# Patient Record
Sex: Male | Born: 1960 | Race: White | Hispanic: No | Marital: Married | Smoking: Never smoker
Health system: Southern US, Community
[De-identification: ages and names within clinical notes are randomized; demographics above are authoritative.]

## PROBLEM LIST (undated history)

## (undated) DIAGNOSIS — N200 Calculus of kidney: Secondary | ICD-10-CM

## (undated) DIAGNOSIS — Z973 Presence of spectacles and contact lenses: Secondary | ICD-10-CM

## (undated) DIAGNOSIS — I10 Essential (primary) hypertension: Secondary | ICD-10-CM

## (undated) DIAGNOSIS — Z972 Presence of dental prosthetic device (complete) (partial): Secondary | ICD-10-CM

## (undated) HISTORY — DX: Essential (primary) hypertension: I10

---

## 2005-12-27 HISTORY — PX: FOOT SURGERY: SHX648

## 2008-12-12 DIAGNOSIS — R7989 Other specified abnormal findings of blood chemistry: Secondary | ICD-10-CM | POA: Insufficient documentation

## 2014-05-30 ENCOUNTER — Ambulatory Visit: Payer: Self-pay | Admitting: Family Medicine

## 2014-12-04 ENCOUNTER — Emergency Department: Payer: Self-pay | Admitting: Emergency Medicine

## 2014-12-04 LAB — CBC
HCT: 43 % (ref 40.0–52.0)
HGB: 14.2 g/dL (ref 13.0–18.0)
MCH: 29.2 pg (ref 26.0–34.0)
MCHC: 32.9 g/dL (ref 32.0–36.0)
MCV: 89 fL (ref 80–100)
Platelet: 219 10*3/uL (ref 150–440)
RBC: 4.86 10*6/uL (ref 4.40–5.90)
RDW: 13.7 % (ref 11.5–14.5)
WBC: 10.4 10*3/uL (ref 3.8–10.6)

## 2014-12-04 LAB — COMPREHENSIVE METABOLIC PANEL
ALK PHOS: 94 U/L
ALT: 19 U/L
Albumin: 4.1 g/dL (ref 3.4–5.0)
Anion Gap: 5 — ABNORMAL LOW (ref 7–16)
BUN: 10 mg/dL (ref 7–18)
Bilirubin,Total: 0.6 mg/dL (ref 0.2–1.0)
Calcium, Total: 9.4 mg/dL (ref 8.5–10.1)
Chloride: 100 mmol/L (ref 98–107)
Co2: 30 mmol/L (ref 21–32)
Creatinine: 1.5 mg/dL — ABNORMAL HIGH (ref 0.60–1.30)
EGFR (African American): >60
GFR CALC NON AF AMER: 52 — AB
Glucose: 118 mg/dL — ABNORMAL HIGH (ref 65–99)
OSMOLALITY: 270 (ref 275–301)
POTASSIUM: 4.1 mmol/L (ref 3.5–5.1)
SGOT(AST): 11 U/L — ABNORMAL LOW (ref 15–37)
Sodium: 135 mmol/L — ABNORMAL LOW (ref 136–145)
TOTAL PROTEIN: 7.6 g/dL (ref 6.4–8.2)

## 2014-12-04 LAB — URINALYSIS, COMPLETE
Bilirubin,UR: NEGATIVE
Glucose,UR: NEGATIVE mg/dL (ref 0–75)
Leukocyte Esterase: NEGATIVE
Nitrite: NEGATIVE
PH: 7 (ref 4.5–8.0)
PROTEIN: NEGATIVE
SQUAMOUS EPITHELIAL: NONE SEEN
Specific Gravity: 1.012 (ref 1.003–1.030)
WBC UR: 2 /HPF (ref 0–5)

## 2015-01-02 ENCOUNTER — Ambulatory Visit: Payer: Self-pay | Admitting: Urology

## 2015-01-09 ENCOUNTER — Ambulatory Visit: Payer: Self-pay | Admitting: Urology

## 2015-01-09 LAB — TSH: TSH: 1.64 u[IU]/mL (ref <=5.90)

## 2015-01-09 LAB — LIPID PANEL
CHOLESTEROL: 218 mg/dL — AB (ref 0–200)
HDL: 46 mg/dL (ref 35–70)
LDL Cholesterol: 159 mg/dL
TRIGLYCERIDES: 63 mg/dL (ref 40–160)

## 2015-01-09 LAB — CBC AND DIFFERENTIAL
HEMATOCRIT: 42 % (ref 41–53)
Hemoglobin: 14.7 g/dL (ref 13.5–17.5)
Platelets: 230 10*3/uL (ref 150–399)
WBC: 5.2 10^3/mL

## 2015-01-09 LAB — BASIC METABOLIC PANEL
BUN: 13 mg/dL (ref 4–21)
Creatinine: 1.3 mg/dL (ref <=1.3)
GLUCOSE: 69 mg/dL
Potassium: 4.3 mmol/L (ref 3.4–5.3)
Sodium: 141 mmol/L (ref 137–147)

## 2015-01-09 LAB — HEPATIC FUNCTION PANEL
ALT: 11 U/L (ref 10–40)
AST: 14 U/L (ref 14–40)

## 2015-01-10 DIAGNOSIS — I10 Essential (primary) hypertension: Secondary | ICD-10-CM | POA: Insufficient documentation

## 2015-01-10 DIAGNOSIS — N200 Calculus of kidney: Secondary | ICD-10-CM | POA: Insufficient documentation

## 2015-01-23 ENCOUNTER — Ambulatory Visit: Payer: Self-pay | Admitting: Urology

## 2015-06-11 DIAGNOSIS — IMO0001 Reserved for inherently not codable concepts without codable children: Secondary | ICD-10-CM | POA: Insufficient documentation

## 2015-06-11 DIAGNOSIS — E785 Hyperlipidemia, unspecified: Secondary | ICD-10-CM | POA: Insufficient documentation

## 2015-06-11 DIAGNOSIS — M7989 Other specified soft tissue disorders: Secondary | ICD-10-CM | POA: Insufficient documentation

## 2015-06-11 DIAGNOSIS — R030 Elevated blood-pressure reading, without diagnosis of hypertension: Secondary | ICD-10-CM

## 2015-06-11 DIAGNOSIS — B353 Tinea pedis: Secondary | ICD-10-CM | POA: Insufficient documentation

## 2015-06-11 DIAGNOSIS — L309 Dermatitis, unspecified: Secondary | ICD-10-CM | POA: Insufficient documentation

## 2015-06-13 ENCOUNTER — Ambulatory Visit (INDEPENDENT_AMBULATORY_CARE_PROVIDER_SITE_OTHER): Payer: Federal, State, Local not specified - PPO

## 2015-06-13 DIAGNOSIS — Z23 Encounter for immunization: Secondary | ICD-10-CM | POA: Diagnosis not present

## 2015-06-23 ENCOUNTER — Ambulatory Visit: Payer: Self-pay

## 2015-10-08 ENCOUNTER — Ambulatory Visit (INDEPENDENT_AMBULATORY_CARE_PROVIDER_SITE_OTHER): Payer: Federal, State, Local not specified - PPO | Admitting: Family Medicine

## 2015-10-08 ENCOUNTER — Encounter: Payer: Self-pay | Admitting: Family Medicine

## 2015-10-08 VITALS — BP 122/78 | HR 87 | Temp 98.3°F | Resp 16 | Wt 219.4 lb

## 2015-10-08 DIAGNOSIS — R059 Cough, unspecified: Secondary | ICD-10-CM

## 2015-10-08 DIAGNOSIS — L301 Dyshidrosis [pompholyx]: Secondary | ICD-10-CM | POA: Diagnosis not present

## 2015-10-08 DIAGNOSIS — R05 Cough: Secondary | ICD-10-CM

## 2015-10-08 MED ORDER — HYDROCODONE-HOMATROPINE 5-1.5 MG/5ML PO SYRP: ORAL_SOLUTION | ORAL | Status: DC

## 2015-10-08 NOTE — Progress Notes (Signed)
Subjective:     Patient ID: Samuel Mitchell, male   DOB: 10-03-1961, 54 y.o.   MRN: [id number]  HPI  Chief Complaint  Patient presents with  . Cough    Patient comes in office today with concerns of cough for the past 3-4 days. Patient describes cough as dry, he denies any other associated symptoms .   States he does not feel sick but the cough is keeping him up at night. Also states cracking on the bottom of his feet keeps recurring despite rx for Tinea and eczema over the last year. Reports it seems to worsen when he wears his work boots. He tends to sweat a lot and does not wear wicking socks.   Review of Systems  Constitutional: Negative for fever and chills.       Objective:   Physical Exam  Constitutional: He appears well-developed and well-nourished. No distress.  Ears: T.M's intact without inflammation Throat: no tonsillar enlargement or exudate Neck: no cervical adenopathy Lungs: clear Skin: cracking of plantar feet noted    Assessment:    1. Cough: suspect early URI. - HYDROcodone-homatropine (HYCODAN) 5-1.5 MG/5ML syrup; 5 ml 4-6 hours as needed for cough  Dispense: 240 mL; Refill: 0  2. Dyshidrotic eczema    Plan:    Change to wicking socks with his work boots. Consider dermatology referral. Call if cough not improving.

## 2015-10-08 NOTE — Patient Instructions (Signed)
Try wicking socks with polypropolene for your foot eczema. Call if you wish dermatology referral.

## 2015-12-15 IMAGING — CT CT ABD-PELV W/O CM
2 of 4 series · 17 of 46 positions shown, 19 images · non-contrast
Comparison: None.

CLINICAL DATA: Left lower quadrant pain.  History of kidney stones.

EXAM:
CT ABDOMEN AND PELVIS WITHOUT CONTRAST
TECHNIQUE: Multidetector CT imaging of the abdomen and pelvis was performed
following the standard protocol without IV contrast.

[Series 2: stone standard full · axial · 0.84mm/px · z∈[+214,+654]mm · 14 of 98 slices shown, 16 images]
[im 5/98  soft-tissue]
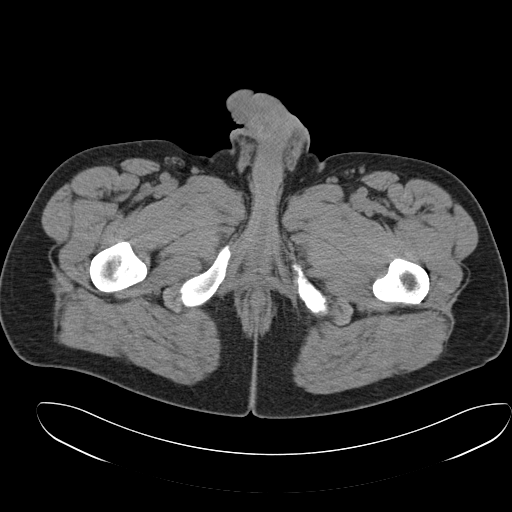
[im 5/98  bone]
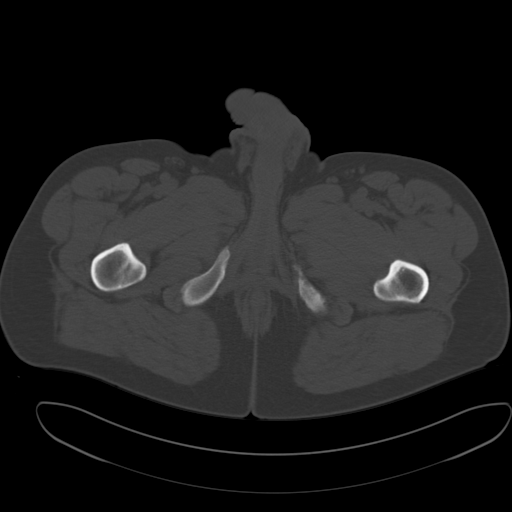
[im 13/98  soft-tissue]
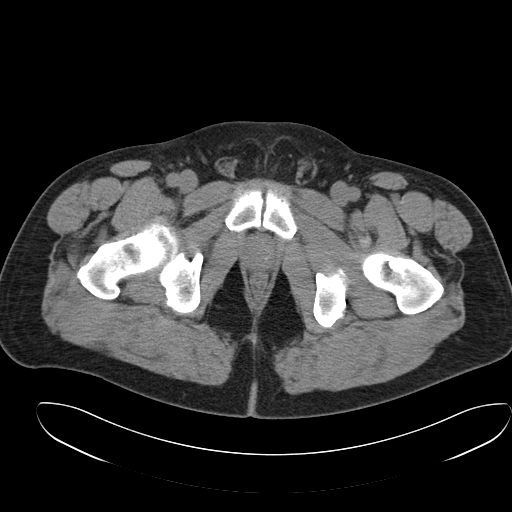
[im 21/98  soft-tissue]
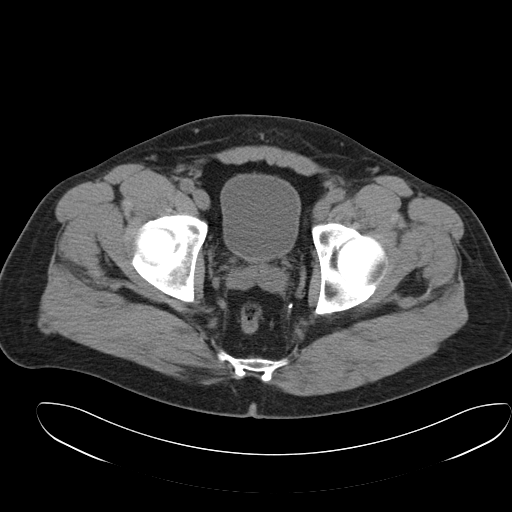
[im 25/98  soft-tissue]
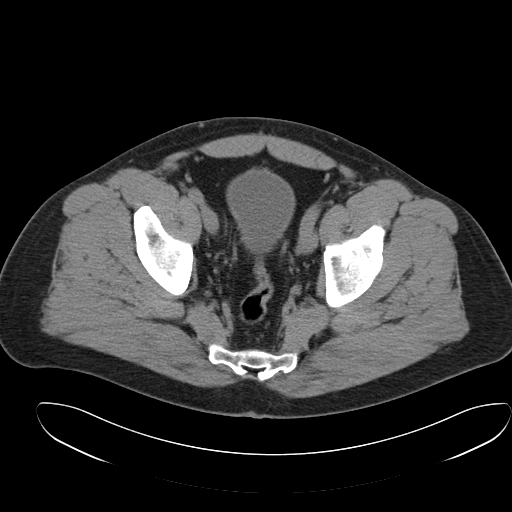
[im 33/98  soft-tissue]
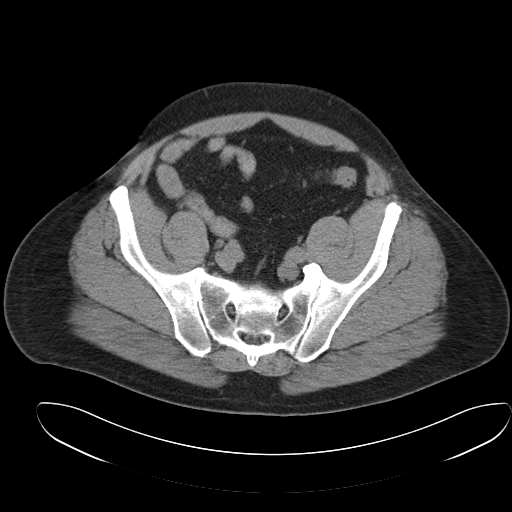
[im 41/98  soft-tissue]
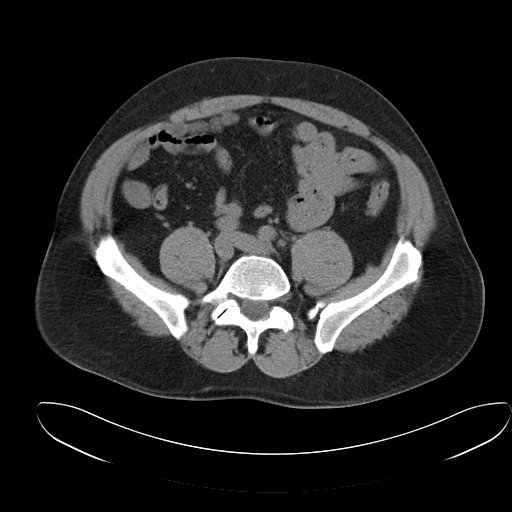
[im 45/98  soft-tissue]
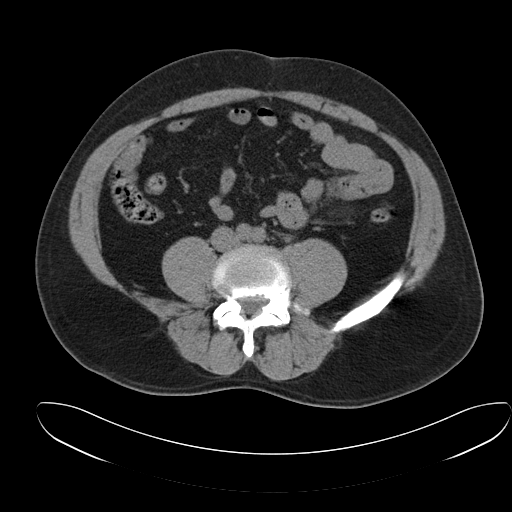
[im 53/98  soft-tissue]
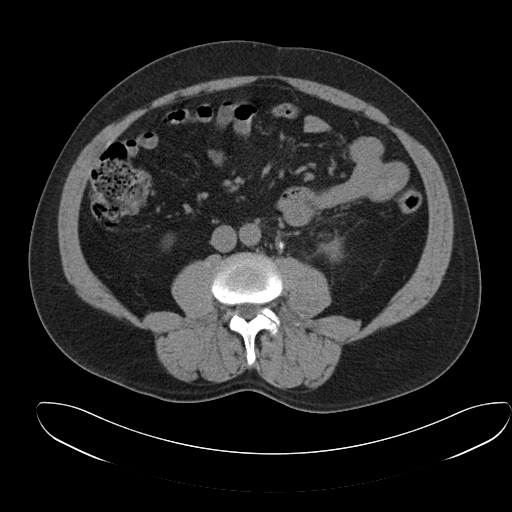
[im 57/98  soft-tissue]
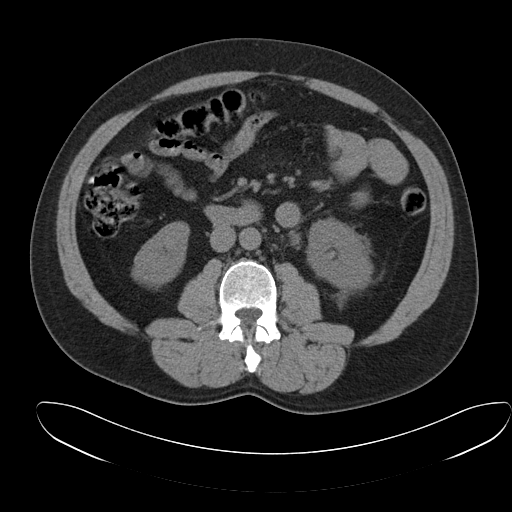
[im 57/98  bone]
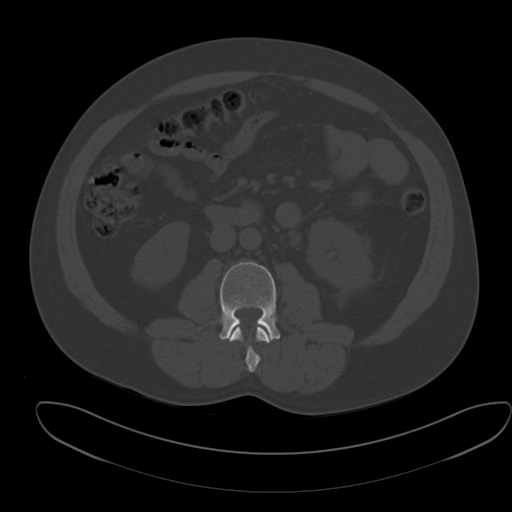
[im 65/98  soft-tissue]
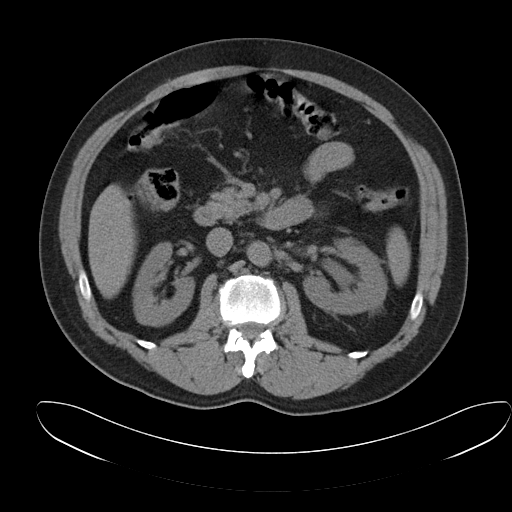
[im 73/98  soft-tissue]
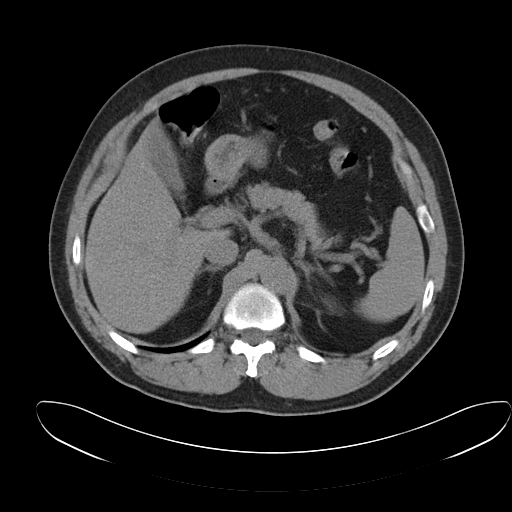
[im 77/98  soft-tissue]
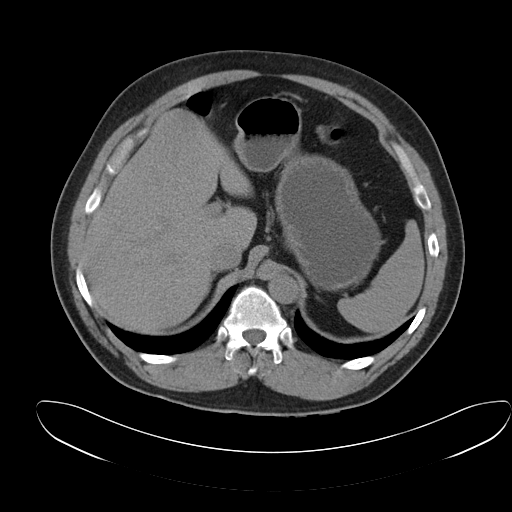
[im 85/98  soft-tissue]
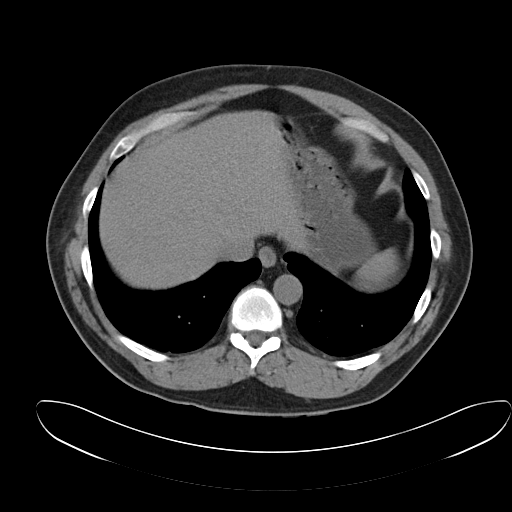
[im 93/98  soft-tissue]
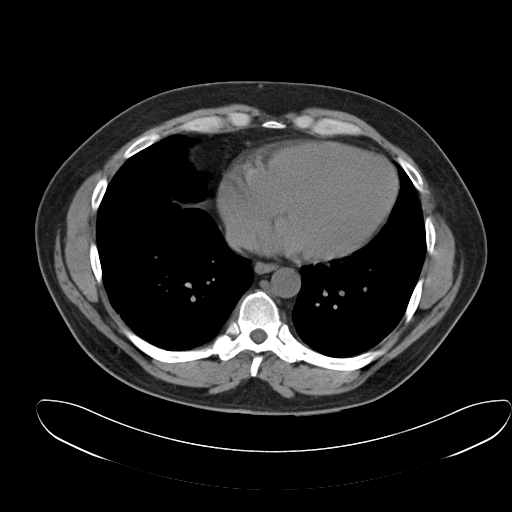

[Series 5: cor stone standard full · coronal · 0.79mm/px · 3 of 153 slices shown]
[im 51/153  soft-tissue]
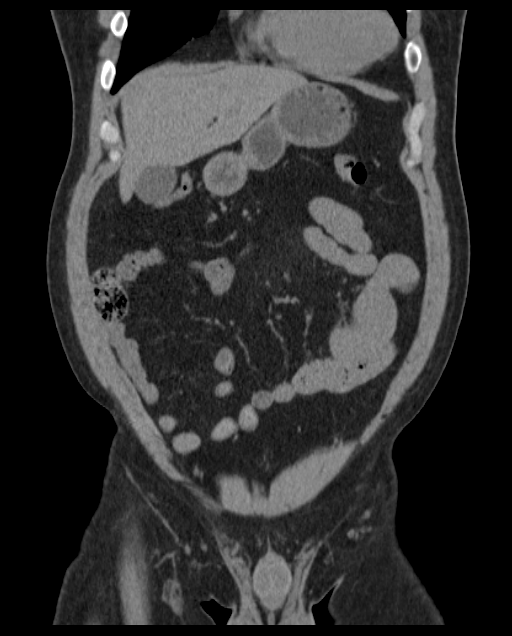
[im 68/153  soft-tissue]
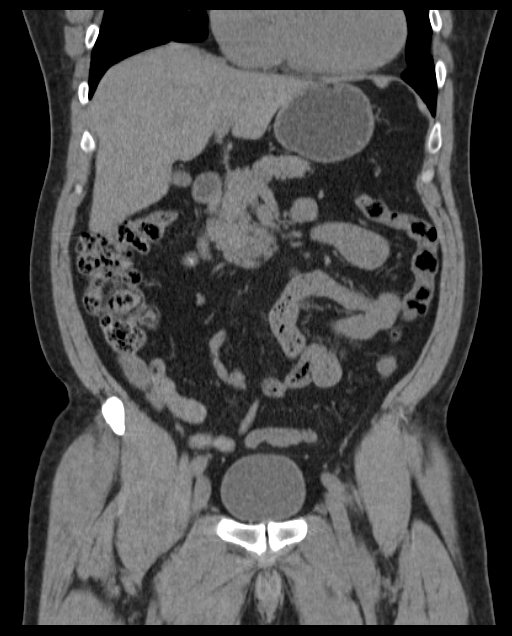
[im 85/153  soft-tissue]
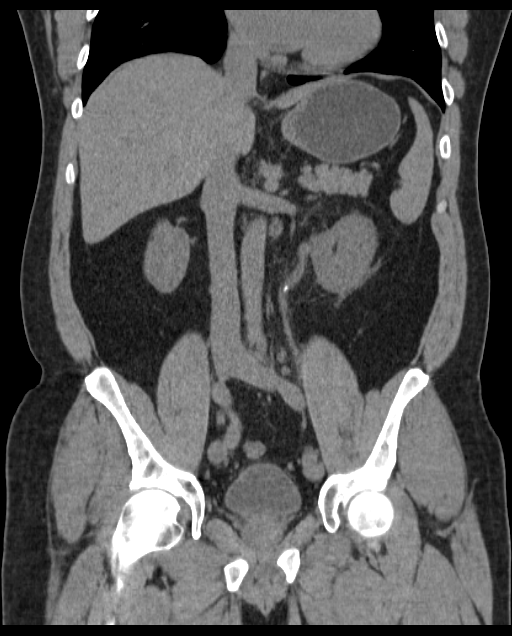

[17 of 46 positions shown; findings below may reference images not displayed]

FINDINGS: There is 7 mm stone obstructing the proximal left ureter at the L3-4
level. There is moderate left hydronephrosis. There are no renal
calculi. Liver, biliary tree, spleen, pancreas, and adrenal glands
and right kidney are normal. The bowel is normal including the
terminal ileum and appendix. Bladder is normal. Prostate gland is
not enlarged. No free air or free fluid.

No significant osseous abnormality.
IMPRESSION: 7 mm stone obstructing the proximal left ureter creating moderate
left hydronephrosis.

## 2016-03-05 ENCOUNTER — Other Ambulatory Visit: Payer: Self-pay | Admitting: Family Medicine

## 2016-03-05 ENCOUNTER — Other Ambulatory Visit: Payer: Self-pay

## 2016-03-05 DIAGNOSIS — I10 Essential (primary) hypertension: Secondary | ICD-10-CM

## 2016-03-05 MED ORDER — AMLODIPINE BESYLATE 10 MG PO TABS: 10.0000 mg | ORAL_TABLET | Freq: Every day | ORAL | Status: DC

## 2016-03-15 ENCOUNTER — Other Ambulatory Visit: Payer: Self-pay | Admitting: Family Medicine

## 2016-07-09 ENCOUNTER — Other Ambulatory Visit: Payer: Self-pay | Admitting: Family Medicine

## 2016-08-12 ENCOUNTER — Ambulatory Visit (INDEPENDENT_AMBULATORY_CARE_PROVIDER_SITE_OTHER): Payer: Federal, State, Local not specified - PPO | Admitting: Family Medicine

## 2016-08-12 ENCOUNTER — Encounter: Payer: Self-pay | Admitting: Family Medicine

## 2016-08-12 VITALS — BP 120/82 | HR 70 | Temp 97.9°F | Resp 16 | Wt 221.0 lb

## 2016-08-12 DIAGNOSIS — E785 Hyperlipidemia, unspecified: Secondary | ICD-10-CM

## 2016-08-12 DIAGNOSIS — Z1211 Encounter for screening for malignant neoplasm of colon: Secondary | ICD-10-CM

## 2016-08-12 DIAGNOSIS — I10 Essential (primary) hypertension: Secondary | ICD-10-CM | POA: Diagnosis not present

## 2016-08-12 NOTE — Progress Notes (Signed)
Subjective:     Patient ID: Samuel Mitchell, male   DOB: [birth year], 55 y.o.   MRN: [id number]  HPI  Chief Complaint  Patient presents with  . Hypertension    Patient returns back to office today for follow up. Patient last office visit was 12/31/14, patient was advised to continue to monitor blood pressure. Blood pressure at last visit was 144/92, patient reports good compliance on Amlodipine.      Review of Systems  Respiratory: Negative for shortness of breath.   Cardiovascular: Negative for chest pain and palpitations.       Objective:   Physical Exam  Constitutional: He appears well-developed and well-nourished. No distress.  Cardiovascular: Normal rate and regular rhythm.   Pulmonary/Chest: Breath sounds normal.  Musculoskeletal: He exhibits no edema (of lower extremities).       Assessment:    1. Essential (primary) hypertension - Comprehensive metabolic panel  2. HLD (hyperlipidemia) - Lipid panel  3. Screen for colon cancer - Ambulatory referral to Gastroenterology    Plan:    Further f/u pending lab work.

## 2016-08-12 NOTE — Patient Instructions (Signed)
We will call you with the lab results and the referral for colonoscopy. 

## 2016-08-16 ENCOUNTER — Telehealth: Payer: Self-pay

## 2016-08-16 ENCOUNTER — Other Ambulatory Visit: Payer: Self-pay

## 2016-08-16 NOTE — Telephone Encounter (Signed)
Gastroenterology Pre-Procedure Review  Request Date: 09/09/2016 Requesting Physician: Dr. Jaynie Crumble  PATIENT REVIEW QUESTIONS: The patient responded to the following health history questions as indicated:    1. Are you having any GI issues? no 2. Do you have a personal history of Polyps? no 3. Do you have a family history of Colon Cancer or Polyps? no 4. Diabetes Mellitus? no 5. Joint replacements in the past 12 months?no 6. Major health problems in the past 3 months?no 7. Any artificial heart valves, MVP, or defibrillator?no    MEDICATIONS & ALLERGIES:    Patient reports the following regarding taking any anticoagulation/antiplatelet therapy:   Plavix, Coumadin, Eliquis, Xarelto, Lovenox, Pradaxa, Brilinta, or Effient? no Aspirin? no  Patient confirms/reports the following medications:  Current Outpatient Prescriptions  Medication Sig Dispense Refill  . amLODipine (NORVASC) 10 MG tablet TAKE 1 TABLET (10 MG TOTAL) BY MOUTH DAILY. 90 tablet 0  . triamcinolone ointment (KENALOG) 0.1 % APPLY TO AFFECTED AREA OF FEET 2 TO 3 TIMES A DAY 60 g 4   No current facility-administered medications for this visit.     Patient confirms/reports the following allergies:  No Known Allergies  No orders of the defined types were placed in this encounter.   AUTHORIZATION INFORMATION Primary Insurance: 1D#: Group #:  Secondary Insurance: 1D#: Group #:  SCHEDULE INFORMATION: Date: 09/09/2016 Time: Location: [facility]

## 2016-08-16 NOTE — Telephone Encounter (Signed)
Screening Colonoscopy Z12.11  [facility] [id number] Please pre cert

## 2016-09-01 ENCOUNTER — Encounter: Payer: Self-pay | Admitting: *Deleted

## 2016-09-03 NOTE — Discharge Instructions (Signed)

## 2016-09-09 ENCOUNTER — Ambulatory Visit: Payer: Federal, State, Local not specified - PPO | Admitting: Anesthesiology

## 2016-09-09 ENCOUNTER — Encounter
Admission: RE | Disposition: A | Discharge status: Home / Self Care | Payer: Self-pay | Source: Ambulatory Visit | Attending: Gastroenterology

## 2016-09-09 ENCOUNTER — Ambulatory Visit
Admission: RE | Admit: 2016-09-09 | Discharge: 2016-09-09 | Disposition: A | Discharge status: Home / Self Care | Payer: Federal, State, Local not specified - PPO | Source: Ambulatory Visit | Attending: Gastroenterology | Admitting: Gastroenterology

## 2016-09-09 DIAGNOSIS — Z1211 Encounter for screening for malignant neoplasm of colon: Secondary | ICD-10-CM

## 2016-09-09 DIAGNOSIS — Z79899 Other long term (current) drug therapy: Secondary | ICD-10-CM | POA: Diagnosis not present

## 2016-09-09 DIAGNOSIS — K641 Second degree hemorrhoids: Secondary | ICD-10-CM | POA: Diagnosis not present

## 2016-09-09 DIAGNOSIS — I10 Essential (primary) hypertension: Secondary | ICD-10-CM | POA: Diagnosis not present

## 2016-09-09 HISTORY — PX: COLONOSCOPY WITH PROPOFOL: SHX5780

## 2016-09-09 HISTORY — DX: Presence of dental prosthetic device (complete) (partial): Z97.2

## 2016-09-09 HISTORY — DX: Presence of spectacles and contact lenses: Z97.3

## 2016-09-09 HISTORY — DX: Calculus of kidney: N20.0

## 2016-09-09 SURGERY — COLONOSCOPY WITH PROPOFOL
Anesthesia: Monitor Anesthesia Care | Wound class: Contaminated

## 2016-09-09 MED ORDER — PROPOFOL 10 MG/ML IV BOLUS
INTRAVENOUS | Status: DC | PRN
  Administered 2016-09-09 (×2): 30 mg via INTRAVENOUS
  Administered 2016-09-09 (×2): 20 mg via INTRAVENOUS
  Administered 2016-09-09: 30 mg via INTRAVENOUS
  Administered 2016-09-09: 20 mg via INTRAVENOUS
  Administered 2016-09-09: 70 mg via INTRAVENOUS

## 2016-09-09 MED ORDER — LACTATED RINGERS IV SOLN
INTRAVENOUS | Status: DC
  Administered 2016-09-09: 10:00:00 via INTRAVENOUS

## 2016-09-09 MED ORDER — LIDOCAINE HCL (CARDIAC) 20 MG/ML IV SOLN
INTRAVENOUS | Status: DC | PRN
  Administered 2016-09-09: 50 mg via INTRAVENOUS

## 2016-09-09 MED ORDER — ONDANSETRON HCL 4 MG/2ML IJ SOLN: 4.0000 mg | Freq: Once | INTRAMUSCULAR | Status: DC | PRN

## 2016-09-09 MED ORDER — ACETAMINOPHEN 325 MG PO TABS: 325.0000 mg | ORAL_TABLET | ORAL | Status: DC | PRN

## 2016-09-09 MED ORDER — STERILE WATER FOR IRRIGATION IR SOLN
Status: DC | PRN
  Administered 2016-09-09: 11:00:00

## 2016-09-09 MED ORDER — ACETAMINOPHEN 160 MG/5ML PO SOLN: 325.0000 mg | ORAL | Status: DC | PRN

## 2016-09-09 SURGICAL SUPPLY — 23 items

## 2016-09-09 NOTE — Transfer of Care (Signed)
Immediate Anesthesia Transfer of Care Note  Patient: Samuel Mitchell  Procedure(s) Performed: Procedure(s): COLONOSCOPY WITH PROPOFOL (N/A)  Patient Location: PACU  Anesthesia Type: MAC  Level of Consciousness: awake, alert  and patient cooperative  Airway and Oxygen Therapy: Patient Spontanous Breathing and Patient connected to supplemental oxygen  Post-op Assessment: Post-op Vital signs reviewed, Patient's Cardiovascular Status Stable, Respiratory Function Stable, Patent Airway and No signs of Nausea or vomiting  Post-op Vital Signs: Reviewed and stable  Complications: No apparent anesthesia complications

## 2016-09-09 NOTE — Anesthesia Procedure Notes (Signed)
Procedure Name: MAC Performed by: Perri Aragones Pre-anesthesia Checklist: Patient identified, Emergency Drugs available, Suction available, Timeout performed and Patient being monitored Patient Re-evaluated:Patient Re-evaluated prior to inductionOxygen Delivery Method: Nasal cannula Placement Confirmation: positive ETCO2       

## 2016-09-09 NOTE — Anesthesia Preprocedure Evaluation (Signed)
Anesthesia Evaluation  Patient identified by MRN, date of birth, ID band Patient awake    Reviewed: Allergy & Precautions, NPO status , Unable to perform ROS - Chart review only  Airway Mallampati: II  TM Distance: >3 FB Neck ROM: Full    Dental no notable dental hx.    Pulmonary neg pulmonary ROS,    Pulmonary exam normal        Cardiovascular hypertension, Pt. on medications Normal cardiovascular exam     Neuro/Psych negative neurological ROS     GI/Hepatic negative GI ROS, Neg liver ROS,   Endo/Other  negative endocrine ROS  Renal/GU negative Renal ROS     Musculoskeletal negative musculoskeletal ROS (+)   Abdominal   Peds  Hematology negative hematology ROS (+)   Anesthesia Other Findings   Reproductive/Obstetrics                             Anesthesia Physical Anesthesia Plan  ASA: II  Anesthesia Plan: MAC   Post-op Pain Management:    Induction: Intravenous  Airway Management Planned:   Additional Equipment:   Intra-op Plan:   Post-operative Plan:   Informed Consent: I have reviewed the patients History and Physical, chart, labs and discussed the procedure including the risks, benefits and alternatives for the proposed anesthesia with the patient or authorized representative who has indicated his/her understanding and acceptance.     Plan Discussed with: CRNA  Anesthesia Plan Comments:         Anesthesia Quick Evaluation

## 2016-09-09 NOTE — Anesthesia Postprocedure Evaluation (Signed)
Anesthesia Post Note  Patient: Samuel Mitchell  Procedure(s) Performed: Procedure(s) (LRB): COLONOSCOPY WITH PROPOFOL (N/A)  Patient location during evaluation: PACU Anesthesia Type: MAC Level of consciousness: awake and alert and oriented Pain management: pain level controlled Vital Signs Assessment: post-procedure vital signs reviewed and stable Respiratory status: spontaneous breathing and nonlabored ventilation Cardiovascular status: stable Postop Assessment: no signs of nausea or vomiting and adequate PO intake Anesthetic complications: no    Estill Batten

## 2016-09-09 NOTE — Op Note (Signed)
[facility] Patient Name: Samuel Mitchell Procedure Date: 09/09/2016 10:58 AM MRN: [id number] Account #: [id number] Date of Birth: 1961-06-20 Admit Type: Outpatient Age: 55 Room: [facility] OR ROOM 01 Gender: Male Note Status: Finalized Procedure:            Colonoscopy Indications:          Screening for colorectal malignant neoplasm Providers:            Lucilla Lame MD, MD Referring MD:         Rose Fillers. Jaynie Crumble, MD (Referring MD) Medicines:            Propofol per Anesthesia Complications:        No immediate complications. Procedure:            Pre-Anesthesia Assessment:                       - Prior to the procedure, a History and Physical was                        performed, and patient medications and allergies were                        reviewed. The patient's tolerance of previous                        anesthesia was also reviewed. The risks and benefits of                        the procedure and the sedation options and risks were                        discussed with the patient. All questions were                        answered, and informed consent was obtained. Prior                        Anticoagulants: The patient has taken no previous                        anticoagulant or antiplatelet agents. ASA Grade                        Assessment: II - A patient with mild systemic disease.                        After reviewing the risks and benefits, the patient was                        deemed in satisfactory condition to undergo the                        procedure.                       After obtaining informed consent, the colonoscope was                        passed under direct vision. Throughout the procedure,  the patient's blood pressure, pulse, and oxygen                        saturations were monitored continuously. The was                        introduced through the anus and advanced to the the                  cecum, identified by appendiceal orifice and ileocecal                        valve. The colonoscopy was performed without                        difficulty. The patient tolerated the procedure well.                        The quality of the bowel preparation was excellent. Findings:      The perianal and digital rectal examinations were normal.      Non-bleeding internal hemorrhoids were found during retroflexion. The       hemorrhoids were Grade II (internal hemorrhoids that prolapse but reduce       spontaneously). Impression:           - Non-bleeding internal hemorrhoids.                       - No specimens collected. Recommendation:       - Repeat colonoscopy in 10 years for screening unless                        any change in family history or lower GI problems.                       - Discharge patient to home.                       - Resume previous diet.                       - Continue present medications. Procedure Code(s):    --- Professional ---                       [phone], Colonoscopy, flexible; diagnostic, including                        collection of specimen(s) by brushing or washing, when                        performed (separate procedure) Diagnosis Code(s):    --- Professional ---                       Z12.11, Encounter for screening for malignant neoplasm                        of colon CPT copyright 2016 American Medical Association. All rights reserved. The codes documented in this report are preliminary and upon coder review may  be revised to meet current compliance requirements. Lucilla Lame MD, MD 09/09/2016 11:18:32 AM This report has been signed electronically. Number of Addenda:  0 Note Initiated On: 09/09/2016 10:58 AM Scope Withdrawal Time: 0 hours 6 minutes 5 seconds  Total Procedure Duration: 0 hours 7 minutes 50 seconds       [facility]

## 2016-09-09 NOTE — H&P (Signed)
  Lucilla Lame, MD [facility] [address] [place], [place] [address] Phone: [phone] Fax : [phone]  Primary Care Physician:  Carmon Ginsberg, [place] Primary Gastroenterologist:  Dr. Allen Norris  Pre-Procedure History & Physical: HPI:  Samuel Mitchell is a 55 y.o. male is here for a screening colonoscopy.   Past Medical History:  Diagnosis Date  . Hypertension   . Kidney stones   . Wears contact lenses   . Wears dentures    full upper    Past Surgical History:  Procedure Laterality Date  . FOOT SURGERY Left 2007    Prior to Admission medications   Medication Sig Start Date End Date Taking? Authorizing Provider  amLODipine (NORVASC) 10 MG tablet TAKE 1 TABLET (10 MG TOTAL) BY MOUTH DAILY. 07/09/16  Yes Carmon Ginsberg, PA  triamcinolone ointment (KENALOG) 0.1 % APPLY TO AFFECTED AREA OF FEET 2 TO 3 TIMES A DAY 03/15/16   Carmon Ginsberg, PA    Allergies as of 08/16/2016  . (No Known Allergies)    Family History  Problem Relation Age of Onset  . Alcohol abuse Father   . Hypertension Brother     Social History   Social History  . Marital status: Married    Spouse name: N/A  . Number of children: N/A  . Years of education: N/A   Occupational History  . Not on file.   Social History Main Topics  . Smoking status: Never Smoker  . Smokeless tobacco: Never Used  . Alcohol use No  . Drug use: Unknown  . Sexual activity: Not on file   Other Topics Concern  . Not on file   Social History Narrative  . No narrative on file    Review of Systems: See HPI, otherwise negative ROS  Physical Exam: BP 118/79   Pulse 87   Temp 98.7 F (37.1 C) (Temporal)   Ht 5\' 10"  (1.778 m)   Wt 213 lb (96.6 kg)   SpO2 95%   BMI 30.56 kg/m  General:   Alert,  pleasant and cooperative in NAD Head:  Normocephalic and atraumatic. Neck:  Supple; no masses or thyromegaly. Lungs:  Clear throughout to auscultation.    Heart:  Regular rate and rhythm. Abdomen:  Soft, nontender  and nondistended. Normal bowel sounds, without guarding, and without rebound.   Neurologic:  Alert and  oriented x4;  grossly normal neurologically.  Impression/Plan: Samuel Mitchell is now here to undergo a screening colonoscopy.  Risks, benefits, and alternatives regarding colonoscopy have been reviewed with the patient.  Questions have been answered.  All parties agreeable.

## 2016-09-10 ENCOUNTER — Encounter: Payer: Self-pay | Admitting: Gastroenterology

## 2016-10-06 ENCOUNTER — Other Ambulatory Visit: Payer: Self-pay | Admitting: Family Medicine

## 2016-10-06 NOTE — Telephone Encounter (Signed)
Samuel Mitchell patient

## 2017-01-06 ENCOUNTER — Other Ambulatory Visit: Payer: Self-pay | Admitting: Physician Assistant

## 2017-01-06 NOTE — Telephone Encounter (Signed)
Last seen in office by you on 08/12/16. Please review. KW

## 2017-06-13 ENCOUNTER — Encounter: Payer: Self-pay | Admitting: Podiatry

## 2017-06-13 ENCOUNTER — Ambulatory Visit (INDEPENDENT_AMBULATORY_CARE_PROVIDER_SITE_OTHER): Payer: Federal, State, Local not specified - PPO

## 2017-06-13 ENCOUNTER — Ambulatory Visit (INDEPENDENT_AMBULATORY_CARE_PROVIDER_SITE_OTHER): Payer: Federal, State, Local not specified - PPO | Admitting: Podiatry

## 2017-06-13 DIAGNOSIS — M722 Plantar fascial fibromatosis: Secondary | ICD-10-CM

## 2017-06-13 MED ORDER — MELOXICAM 15 MG PO TABS: 15.0000 mg | ORAL_TABLET | Freq: Every day | ORAL | 3 refills | Status: DC

## 2017-06-13 MED ORDER — METHYLPREDNISOLONE 4 MG PO TBPK: ORAL_TABLET | ORAL | 0 refills | Status: DC

## 2017-06-13 NOTE — Progress Notes (Signed)
   Subjective:    Patient ID: Samuel Mitchell, male    DOB: [birth year], 56 y.o.   MRN: [id number]  HPI: He presents today chief complaint of a plantar heel pain right. This is an aching for about 8 months. We did surgery to the left foot many years ago. He relates morning pain to the right foot is tried massaging it with frozen water bottle nothing seems to help. He continues to work for the [employer].    Review of Systems  All other systems reviewed and are negative.      Objective:   Physical Exam: Vital signs are stable he is alert and oriented 3. Pulses are palpable. Neurologic sensorium is intact. Deep tendon reflexes are intact. Muscle strength was 5 over 5 dorsiflexion plantar flexors and inverters everters OF the musculature is intact. Orthopedic evaluationpain on palpation medial calcaneal tubercle of the right foot. Radiographs taken today demonstrates soft tissue increase in density of the plantar fascia cancer site of the right heel. No lesions or wounds are found on physical exam.        Assessment & Plan:  Plantar fasciitis right foot.  Plan: Injected the right heel today with Kenalog and local anesthetic 20 mg was utilized. Placement of plantar fascia brace and he already has a night splint at home. Started him on a Medrol Dosepak to be followed by meloxicam. Follow up with him in 1 month.

## 2017-06-13 NOTE — Patient Instructions (Signed)

## 2017-07-06 ENCOUNTER — Other Ambulatory Visit: Payer: Self-pay | Admitting: Family Medicine

## 2017-07-13 ENCOUNTER — Encounter: Payer: Self-pay | Admitting: Podiatry

## 2017-07-13 ENCOUNTER — Ambulatory Visit (INDEPENDENT_AMBULATORY_CARE_PROVIDER_SITE_OTHER): Payer: Federal, State, Local not specified - PPO | Admitting: Podiatry

## 2017-07-13 DIAGNOSIS — M722 Plantar fascial fibromatosis: Secondary | ICD-10-CM

## 2017-07-13 NOTE — Progress Notes (Signed)
He presents today with about a 70% improvement to his plantar fasciitis of his right foot. He continues to take his medication. Continues to wear his splint.  Objective: Pulses are strongly palpable. Neurologic sensorium is intact. Deep tendon reflexes are intact. He has pain on palpation medially continued him on the right heel that much improved from previous evaluation.  Assessment: Plantar fasciitis right heel resolving.  Plan: Reinjected today with Kenalog and local anesthetic follow-up with him in 1 month.

## 2017-08-24 ENCOUNTER — Ambulatory Visit: Payer: Federal, State, Local not specified - PPO | Admitting: Podiatry

## 2017-10-05 ENCOUNTER — Other Ambulatory Visit: Payer: Self-pay | Admitting: Family Medicine

## 2018-01-08 ENCOUNTER — Other Ambulatory Visit: Payer: Self-pay | Admitting: Family Medicine

## 2018-01-31 ENCOUNTER — Encounter: Payer: Self-pay | Admitting: Family Medicine

## 2018-01-31 ENCOUNTER — Ambulatory Visit: Payer: Federal, State, Local not specified - PPO | Admitting: Family Medicine

## 2018-01-31 VITALS — BP 138/82 | HR 88 | Temp 98.4°F | Resp 16 | Wt 223.0 lb

## 2018-01-31 DIAGNOSIS — R05 Cough: Secondary | ICD-10-CM

## 2018-01-31 DIAGNOSIS — R058 Other specified cough: Secondary | ICD-10-CM

## 2018-01-31 NOTE — Patient Instructions (Signed)
Let's first try Benadryl at night and Claritin or similar during the day. If no improvement after 2-3 days start the acid blockers. If no improvement after a week on acid blockers call me.

## 2018-01-31 NOTE — Progress Notes (Signed)
Subjective:     Patient ID: Samuel Mitchell, male   DOB: 12/23/1961, 57 y.o.   MRN: [id number] Chief Complaint  Patient presents with  . Cough    Patient comes in office today with complaints of dry cough for 6 weeks, patient denues any URI symptoms associated.    HPI States at times it will feel like a tickle in his throat. Does not wake up with cough and denies heartburn or reflux. Cough not worse at night. Reports seasonal allergies but not active at this time. Minimal caffeine use.  Review of Systems     Objective:   Physical Exam  Constitutional: He appears well-developed and well-nourished. No distress.  Pulmonary/Chest: Breath sounds normal.       Assessment:    1. Dry cough: will treat for non-infectious triggers.    Plan:    Will try antihistamines first. If not improving will try Dexilant (2 weeks samples provided. Phone f/u. Consider CXR.

## 2018-04-09 ENCOUNTER — Other Ambulatory Visit: Payer: Self-pay | Admitting: Family Medicine

## 2018-07-12 ENCOUNTER — Other Ambulatory Visit: Payer: Self-pay | Admitting: Family Medicine

## 2019-01-12 ENCOUNTER — Other Ambulatory Visit: Payer: Self-pay | Admitting: Family Medicine

## 2019-04-13 ENCOUNTER — Other Ambulatory Visit: Payer: Self-pay | Admitting: Family Medicine

## 2019-04-13 NOTE — Telephone Encounter (Signed)
Patient has not been seen for a HTN follow up since 2017. Patient needed a OV on 7/24 due to work schedule and not being able to do the e-visit. Patient scheduled with Tawanna Sat at 1:40.

## 2019-04-13 NOTE — Telephone Encounter (Signed)
[facility] faxed refill request for the following medications:  amLODipine (NORVASC) 10 MG tablet    Please advise.  

## 2019-04-20 ENCOUNTER — Ambulatory Visit (INDEPENDENT_AMBULATORY_CARE_PROVIDER_SITE_OTHER): Payer: Federal, State, Local not specified - PPO | Admitting: Physician Assistant

## 2019-04-20 ENCOUNTER — Other Ambulatory Visit: Payer: Self-pay

## 2019-04-20 ENCOUNTER — Encounter: Payer: Self-pay | Admitting: Physician Assistant

## 2019-04-20 VITALS — BP 119/79 | HR 88 | Temp 98.1°F | Wt 221.6 lb

## 2019-04-20 DIAGNOSIS — Z125 Encounter for screening for malignant neoplasm of prostate: Secondary | ICD-10-CM

## 2019-04-20 DIAGNOSIS — E7800 Pure hypercholesterolemia, unspecified: Secondary | ICD-10-CM

## 2019-04-20 DIAGNOSIS — I10 Essential (primary) hypertension: Secondary | ICD-10-CM | POA: Diagnosis not present

## 2019-04-20 DIAGNOSIS — I781 Nevus, non-neoplastic: Secondary | ICD-10-CM

## 2019-04-20 DIAGNOSIS — Z1329 Encounter for screening for other suspected endocrine disorder: Secondary | ICD-10-CM | POA: Diagnosis not present

## 2019-04-20 DIAGNOSIS — Z131 Encounter for screening for diabetes mellitus: Secondary | ICD-10-CM

## 2019-04-20 DIAGNOSIS — Z114 Encounter for screening for human immunodeficiency virus [HIV]: Secondary | ICD-10-CM

## 2019-04-20 DIAGNOSIS — Z1159 Encounter for screening for other viral diseases: Secondary | ICD-10-CM

## 2019-04-20 DIAGNOSIS — D1801 Hemangioma of skin and subcutaneous tissue: Secondary | ICD-10-CM

## 2019-04-20 MED ORDER — AMLODIPINE BESYLATE 10 MG PO TABS: ORAL_TABLET | ORAL | 3 refills | Status: DC

## 2019-04-20 NOTE — Patient Instructions (Signed)
DASH Eating Plan  DASH stands for "Dietary Approaches to Stop Hypertension." The DASH eating plan is a healthy eating plan that has been shown to reduce high blood pressure (hypertension). It may also reduce your risk for type 2 diabetes, heart disease, and stroke. The DASH eating plan may also help with weight loss.  What are tips for following this plan?    General guidelines   Avoid eating more than 2,300 mg (milligrams) of salt (sodium) a day. If you have hypertension, you may need to reduce your sodium intake to 1,500 mg a day.   Limit alcohol intake to no more than 1 drink a day for nonpregnant women and 2 drinks a day for men. One drink equals 12 oz of beer, 5 oz of wine, or 1 oz of hard liquor.   Work with your health care provider to maintain a healthy body weight or to lose weight. Ask what an ideal weight is for you.   Get at least 30 minutes of exercise that causes your heart to beat faster (aerobic exercise) most days of the week. Activities may include walking, swimming, or biking.   Work with your health care provider or diet and nutrition specialist (dietitian) to adjust your eating plan to your individual calorie needs.  Reading food labels     Check food labels for the amount of sodium per serving. Choose foods with less than 5 percent of the Daily Value of sodium. Generally, foods with less than 300 mg of sodium per serving fit into this eating plan.   To find whole grains, look for the word "whole" as the first word in the ingredient list.  Shopping   Buy products labeled as "low-sodium" or "no salt added."   Buy fresh foods. Avoid canned foods and premade or frozen meals.  Cooking   Avoid adding salt when cooking. Use salt-free seasonings or herbs instead of table salt or sea salt. Check with your health care provider or pharmacist before using salt substitutes.   Do not fry foods. Cook foods using healthy methods such as baking, boiling, grilling, and broiling instead.   Cook with  heart-healthy oils, such as olive, canola, soybean, or sunflower oil.  Meal planning   Eat a balanced diet that includes:  ? 5 or more servings of fruits and vegetables each day. At each meal, try to fill half of your plate with fruits and vegetables.  ? Up to 6-8 servings of whole grains each day.  ? Less than 6 oz of lean meat, poultry, or fish each day. A 3-oz serving of meat is about the same size as a deck of cards. One egg equals 1 oz.  ? 2 servings of low-fat dairy each day.  ? A serving of nuts, seeds, or beans 5 times each week.  ? Heart-healthy fats. Healthy fats called Omega-3 fatty acids are found in foods such as flaxseeds and coldwater fish, like sardines, salmon, and mackerel.   Limit how much you eat of the following:  ? Canned or prepackaged foods.  ? Food that is high in trans fat, such as fried foods.  ? Food that is high in saturated fat, such as fatty meat.  ? Sweets, desserts, sugary drinks, and other foods with added sugar.  ? Full-fat dairy products.   Do not salt foods before eating.   Try to eat at least 2 vegetarian meals each week.   Eat more home-cooked food and less restaurant, buffet, and fast food.     When eating at a restaurant, ask that your food be prepared with less salt or no salt, if possible.  What foods are recommended?  The items listed may not be a complete list. Talk with your dietitian about what dietary choices are best for you.  Grains  Whole-grain or whole-wheat bread. Whole-grain or whole-wheat pasta. Brown rice. Oatmeal. Quinoa. Bulgur. Whole-grain and low-sodium cereals. Pita bread. Low-fat, low-sodium crackers. Whole-wheat flour tortillas.  Vegetables  Fresh or frozen vegetables (raw, steamed, roasted, or grilled). Low-sodium or reduced-sodium tomato and vegetable juice. Low-sodium or reduced-sodium tomato sauce and tomato paste. Low-sodium or reduced-sodium canned vegetables.  Fruits  All fresh, dried, or frozen fruit. Canned fruit in natural juice (without  added sugar).  Meat and other protein foods  Skinless chicken or turkey. Ground chicken or turkey. Pork with fat trimmed off. Fish and seafood. Egg whites. Dried beans, peas, or lentils. Unsalted nuts, nut butters, and seeds. Unsalted canned beans. Lean cuts of beef with fat trimmed off. Low-sodium, lean deli meat.  Dairy  Low-fat (1%) or fat-free (skim) milk. Fat-free, low-fat, or reduced-fat cheeses. Nonfat, low-sodium ricotta or cottage cheese. Low-fat or nonfat yogurt. Low-fat, low-sodium cheese.  Fats and oils  Soft margarine without trans fats. Vegetable oil. Low-fat, reduced-fat, or light mayonnaise and salad dressings (reduced-sodium). Canola, safflower, olive, soybean, and sunflower oils. Avocado.  Seasoning and other foods  Herbs. Spices. Seasoning mixes without salt. Unsalted popcorn and pretzels. Fat-free sweets.  What foods are not recommended?  The items listed may not be a complete list. Talk with your dietitian about what dietary choices are best for you.  Grains  Baked goods made with fat, such as croissants, muffins, or some breads. Dry pasta or rice meal packs.  Vegetables  Creamed or fried vegetables. Vegetables in a cheese sauce. Regular canned vegetables (not low-sodium or reduced-sodium). Regular canned tomato sauce and paste (not low-sodium or reduced-sodium). Regular tomato and vegetable juice (not low-sodium or reduced-sodium). Pickles. Olives.  Fruits  Canned fruit in a light or heavy syrup. Fried fruit. Fruit in cream or butter sauce.  Meat and other protein foods  Fatty cuts of meat. Ribs. Fried meat. Bacon. Sausage. Bologna and other processed lunch meats. Salami. Fatback. Hotdogs. Bratwurst. Salted nuts and seeds. Canned beans with added salt. Canned or smoked fish. Whole eggs or egg yolks. Chicken or turkey with skin.  Dairy  Whole or 2% milk, cream, and half-and-half. Whole or full-fat cream cheese. Whole-fat or sweetened yogurt. Full-fat cheese. Nondairy creamers. Whipped toppings.  Processed cheese and cheese spreads.  Fats and oils  Butter. Stick margarine. Lard. Shortening. Ghee. Bacon fat. Tropical oils, such as coconut, palm kernel, or palm oil.  Seasoning and other foods  Salted popcorn and pretzels. Onion salt, garlic salt, seasoned salt, table salt, and sea salt. Worcestershire sauce. Tartar sauce. Barbecue sauce. Teriyaki sauce. Soy sauce, including reduced-sodium. Steak sauce. Canned and packaged gravies. Fish sauce. Oyster sauce. Cocktail sauce. Horseradish that you find on the shelf. Ketchup. Mustard. Meat flavorings and tenderizers. Bouillon cubes. Hot sauce and Tabasco sauce. Premade or packaged marinades. Premade or packaged taco seasonings. Relishes. Regular salad dressings.  Where to find more information:   National Heart, Lung, and Blood Institute: www.nhlbi.nih.gov   American Heart Association: www.heart.org  Summary   The DASH eating plan is a healthy eating plan that has been shown to reduce high blood pressure (hypertension). It may also reduce your risk for type 2 diabetes, heart disease, and stroke.   With the   DASH eating plan, you should limit salt (sodium) intake to 2,300 mg a day. If you have hypertension, you may need to reduce your sodium intake to 1,500 mg a day.   When on the DASH eating plan, aim to eat more fresh fruits and vegetables, whole grains, lean proteins, low-fat dairy, and heart-healthy fats.   Work with your health care provider or diet and nutrition specialist (dietitian) to adjust your eating plan to your individual calorie needs.  This information is not intended to replace advice given to you by your health care provider. Make sure you discuss any questions you have with your health care provider.  Document Released: 12/02/2011 Document Revised: 12/06/2016 Document Reviewed: 12/06/2016  Elsevier Interactive Patient Education  2019 Elsevier Inc.

## 2019-04-20 NOTE — Progress Notes (Signed)
Patient: Samuel Mitchell Male    DOB: 01-02-1961   58 y.o.   MRN: [id number] Visit Date: 04/20/2019  Today's Provider: Mar Daring, PA-C   Chief Complaint  Patient presents with  . Hypertension   Subjective:     HPI  Hypertension, follow-up:  BP Readings from Last 3 Encounters:  04/20/19 119/79  01/31/18 138/82  09/09/16 118/85    He was last seen for hypertension 3 years ago.  BP at that visit was 118/85. Management changes since that visit include none. He reports good compliance with treatment. He is not having side effects.  He is not exercising. He is adherent to low salt diet.   Outside blood pressures are . He is experiencing none.  Patient denies chest pain, chest pressure/discomfort, exertional chest pressure/discomfort, fatigue, irregular heart beat, lower extremity edema, palpitations and tachypnea.   Cardiovascular risk factors include advanced age (older than 76 for men, 59 for women) and hypertension.  Use of agents associated with hypertension: none.     Weight trend: stable Wt Readings from Last 3 Encounters:  04/20/19 221 lb 9.6 oz (100.5 kg)  01/31/18 223 lb (101.2 kg)  09/09/16 213 lb (96.6 kg)    Current diet: well balanced  He also has a spot on his arm he would like looked at. ------------------------------------------------------------------------  No Known Allergies   Current Outpatient Medications:  .  amLODipine (NORVASC) 10 MG tablet, TAKE 1 TABLET (10 MG TOTAL) BY MOUTH DAILY. **NEEDS OFFICE VISIT*, Disp: 90 tablet, Rfl: 0 .  meloxicam (MOBIC) 15 MG tablet, Take 1 tablet (15 mg total) by mouth daily. (Patient not taking: Reported on 01/31/2018), Disp: 30 tablet, Rfl: 3  Review of Systems  Constitutional: Negative.   Respiratory: Negative.   Cardiovascular: Negative.   Musculoskeletal: Negative.   Neurological: Negative.   Psychiatric/Behavioral: Negative.     Social History   Tobacco Use  . Smoking status:  Never Smoker  . Smokeless tobacco: Never Used  Substance Use Topics  . Alcohol use: No    Alcohol/week: 0.0 standard drinks      Objective:   BP 119/79 (BP Location: Left Arm, Patient Position: Sitting, Cuff Size: Large)   Pulse 88   Temp 98.1 F (36.7 C) (Oral)   Wt 221 lb 9.6 oz (100.5 kg)   SpO2 96%   BMI 31.80 kg/m  Vitals:   04/20/19 1338  BP: 119/79  Pulse: 88  Temp: 98.1 F (36.7 C)  TempSrc: Oral  SpO2: 96%  Weight: 221 lb 9.6 oz (100.5 kg)     Physical Exam Vitals signs reviewed.  Constitutional:      General: He is not in acute distress.    Appearance: Normal appearance. He is well-developed. He is obese. He is not ill-appearing or diaphoretic.  HENT:     Head: Normocephalic and atraumatic.  Neck:     Musculoskeletal: Normal range of motion and neck supple.     Thyroid: No thyromegaly.     Vascular: No JVD.     Trachea: No tracheal deviation.  Cardiovascular:     Rate and Rhythm: Normal rate and regular rhythm.     Pulses: Normal pulses.     Heart sounds: Normal heart sounds. No murmur. No friction rub. No gallop.   Pulmonary:     Effort: Pulmonary effort is normal. No respiratory distress.     Breath sounds: Normal breath sounds. No wheezing or rales.  Musculoskeletal:  Right lower leg: No edema.     Left lower leg: No edema.  Lymphadenopathy:     Cervical: No cervical adenopathy.  Skin:      Neurological:     Mental Status: He is alert.        Assessment & Plan    1. Essential (primary) hypertension Stable. Diagnosis pulled for medication refill. Continue current medical treatment plan. Will check labs as below and f/u pending results. - amLODipine (NORVASC) 10 MG tablet; TAKE 1 TABLET (10 MG TOTAL) BY MOUTH DAILY.  Dispense: 90 tablet; Refill: 3 - CBC w/Diff/Platelet - Comprehensive Metabolic Panel (CMET)  2. Pure hypercholesterolemia H/O this. Diet controlled. Will check labs as below and f/u pending results. - CBC  w/Diff/Platelet - Comprehensive Metabolic Panel (CMET) - Lipid Profile  3. Diabetes mellitus screening Will check labs as below and f/u pending results. - HgB A1c  4. Thyroid disorder screen Will check labs as below and f/u pending results. - TSH  5. Prostate cancer screening Will check labs as below and f/u pending results. - PSA  6. Encounter for hepatitis C screening test for low risk patient Will check labs as below and f/u pending results. - Hepatitis C Antibody  7. Screening for HIV without presence of risk factors Will check labs as below and f/u pending results. - HIV antibody (with reflex)  8. Cherry angioma Removed today without issue. See procedure note below.  Procedure Note: Procedure, benefits, risk (including those of bleeding, infection, injury, allergic reaction) and alternatives were explained to the patient who voiced understanding of the information. Questions were sought and answered. The patient agreed to proceed with the cherry angioma removal. Verbal consent was obtained.  The area was prepped with Betadine swab and draped in a sterile fashion. Local anesthetic was administered with 1cc of 1% Xylocaine with epinephrine. The skin tag was then removed using a size 10 scalpel blade. There was minimal bleeding. Hemostasis was intact. The wound was then cleaned and a dry dressing placed. There were no complications.     Mar Daring, PA-C  [facility]

## 2019-04-21 LAB — COMPREHENSIVE METABOLIC PANEL
ALT: 16 IU/L (ref 0–44)
AST: 14 IU/L (ref 0–40)
Albumin/Globulin Ratio: 1.4 (ref 1.2–2.2)
Albumin: 4.1 g/dL (ref 3.8–4.9)
Alkaline Phosphatase: 82 IU/L (ref 39–117)
BUN/Creatinine Ratio: 10 (ref 9–20)
BUN: 12 mg/dL (ref 6–24)
Bilirubin Total: 0.6 mg/dL (ref 0.0–1.2)
CO2: 22 mmol/L (ref 20–29)
Calcium: 9.4 mg/dL (ref 8.7–10.2)
Chloride: 104 mmol/L (ref 96–106)
Creatinine, Ser: 1.22 mg/dL (ref 0.76–1.27)
GFR calc Af Amer: 77 mL/min/{1.73_m2} (ref >59)
GFR calc non Af Amer: 66 mL/min/{1.73_m2} (ref >59)
Globulin, Total: 2.9 g/dL (ref 1.5–4.5)
Glucose: 84 mg/dL (ref 65–99)
Potassium: 4.3 mmol/L (ref 3.5–5.2)
Sodium: 140 mmol/L (ref 134–144)
Total Protein: 7 g/dL (ref 6.0–8.5)

## 2019-04-21 LAB — LIPID PANEL
Chol/HDL Ratio: 5.8 ratio — ABNORMAL HIGH (ref 0.0–5.0)
Cholesterol, Total: 210 mg/dL — ABNORMAL HIGH (ref 100–199)
HDL: 36 mg/dL — ABNORMAL LOW (ref >39)
LDL Calculated: 149 mg/dL — ABNORMAL HIGH (ref 0–99)
Triglycerides: 124 mg/dL (ref 0–149)
VLDL Cholesterol Cal: 25 mg/dL (ref 5–40)

## 2019-04-21 LAB — CBC WITH DIFFERENTIAL/PLATELET
Basophils Absolute: 0 10*3/uL (ref 0.0–0.2)
Basos: 1 %
EOS (ABSOLUTE): 0.1 10*3/uL (ref 0.0–0.4)
Eos: 2 %
Hematocrit: 41.7 % (ref 37.5–51.0)
Hemoglobin: 14.3 g/dL (ref 13.0–17.7)
Immature Grans (Abs): 0 10*3/uL (ref 0.0–0.1)
Immature Granulocytes: 1 %
Lymphocytes Absolute: 1.5 10*3/uL (ref 0.7–3.1)
Lymphs: 26 %
MCH: 29.2 pg (ref 26.6–33.0)
MCHC: 34.3 g/dL (ref 31.5–35.7)
MCV: 85 fL (ref 79–97)
Monocytes Absolute: 0.6 10*3/uL (ref 0.1–0.9)
Monocytes: 10 %
Neutrophils Absolute: 3.7 10*3/uL (ref 1.4–7.0)
Neutrophils: 60 %
Platelets: 255 10*3/uL (ref 150–450)
RBC: 4.89 x10E6/uL (ref 4.14–5.80)
RDW: 13.7 % (ref 11.6–15.4)
WBC: 5.9 10*3/uL (ref 3.4–10.8)

## 2019-04-21 LAB — HEPATITIS C ANTIBODY: Hep C Virus Ab: 0.1 s/co ratio (ref 0.0–0.9)

## 2019-04-21 LAB — TSH: TSH: 2.49 u[IU]/mL (ref 0.450–4.500)

## 2019-04-21 LAB — HIV ANTIBODY (ROUTINE TESTING W REFLEX): HIV Screen 4th Generation wRfx: NONREACTIVE

## 2019-04-21 LAB — PSA: Prostate Specific Ag, Serum: 1.8 ng/mL (ref 0.0–4.0)

## 2019-04-21 LAB — HEMOGLOBIN A1C
Est. average glucose Bld gHb Est-mCnc: 123 mg/dL
Hgb A1c MFr Bld: 5.9 % — ABNORMAL HIGH (ref 4.8–5.6)

## 2019-04-23 ENCOUNTER — Telehealth: Payer: Self-pay

## 2019-04-23 DIAGNOSIS — E7800 Pure hypercholesterolemia, unspecified: Secondary | ICD-10-CM

## 2019-04-23 MED ORDER — ATORVASTATIN CALCIUM 10 MG PO TABS: 10.0000 mg | ORAL_TABLET | Freq: Every day | ORAL | 3 refills | Status: DC

## 2019-04-23 NOTE — Telephone Encounter (Signed)
Patient was advised and states it is fine to send in a cholesterol lowering medication.

## 2019-04-23 NOTE — Telephone Encounter (Signed)
-----   Message from Mar Daring, [place] sent at 04/23/2019  9:22 AM EDT ----- Blood count is normal. Kidney and liver function are normal. Sodium, potassium and calcium are normal. Thyroid normal. Cholesterol is borderline high. Your current risk of having a cardiovascular event over the next 10 years is elevated at 8.1%. If we were to start a cholesterol lowering medication, it could lower that risk to 3.6%. If you are willing to start something I will send in. Either way, also work on lifestyle modifications with healthy dieting (low salt, low fat, limit fried foods, limit red meats) and increasing physical activity to try to get in 150 min moderate activity per week). A1c is also borderline high at 5.9. This is a lower prediabetic range. Again working on the above lifestyle modifications can help to lower this. PSA is normal. Hep C screening and HIV screening is negative (done just once in a lifetime).

## 2019-04-23 NOTE — Telephone Encounter (Signed)
Sent in atorvastatin 

## 2020-04-25 ENCOUNTER — Other Ambulatory Visit: Payer: Self-pay | Admitting: Physician Assistant

## 2020-04-25 DIAGNOSIS — I10 Essential (primary) hypertension: Secondary | ICD-10-CM

## 2020-04-25 DIAGNOSIS — E7800 Pure hypercholesterolemia, unspecified: Secondary | ICD-10-CM

## 2020-04-25 NOTE — Telephone Encounter (Signed)
LMTCB to schedule a physical and follow up chronic diseases. If patient calls back OK for the P[facility] to schedule patient.

## 2020-04-25 NOTE — Telephone Encounter (Signed)
Requested  medications are  due for refill today yes  Requested medications are on the active medication list yes  Last refill 1/28  Future visit scheduled no  Last visit 03/2019  Notes to clinic failed protocol due to visits not within 6 months (for norvasc), labs out of date

## 2020-09-10 ENCOUNTER — Encounter: Payer: Self-pay | Admitting: Podiatry

## 2020-09-10 ENCOUNTER — Ambulatory Visit (INDEPENDENT_AMBULATORY_CARE_PROVIDER_SITE_OTHER): Payer: Federal, State, Local not specified - PPO

## 2020-09-10 ENCOUNTER — Other Ambulatory Visit: Payer: Self-pay

## 2020-09-10 ENCOUNTER — Ambulatory Visit: Payer: Federal, State, Local not specified - PPO | Admitting: Podiatry

## 2020-09-10 DIAGNOSIS — M7750 Other enthesopathy of unspecified foot: Secondary | ICD-10-CM

## 2020-09-10 DIAGNOSIS — M7671 Peroneal tendinitis, right leg: Secondary | ICD-10-CM | POA: Diagnosis not present

## 2020-09-10 DIAGNOSIS — M722 Plantar fascial fibromatosis: Secondary | ICD-10-CM

## 2020-09-10 DIAGNOSIS — M7751 Other enthesopathy of right foot: Secondary | ICD-10-CM

## 2020-09-10 MED ORDER — METHYLPREDNISOLONE 4 MG PO TBPK
ORAL_TABLET | ORAL | 0 refills | Status: DC
Start: 2020-09-10 — End: 2020-10-22

## 2020-09-10 MED ORDER — MELOXICAM 15 MG PO TABS
15.0000 mg | ORAL_TABLET | Freq: Every day | ORAL | 3 refills | Status: DC
Start: 2020-09-10 — End: 2020-12-03

## 2020-09-10 NOTE — Patient Instructions (Signed)

## 2020-09-10 NOTE — Progress Notes (Signed)
Subjective:  Patient ID: Samuel Mitchell, male    DOB: [birth year],  MRN: [id number] HPI Chief Complaint  Patient presents with  . Foot Pain    Patient presents today for right lateral side foot/ankle pain x 2-3 months. He says he has some swelling in his foot/ankle and numbness in top of his foot.  He states "it aches all the time and painful to walk"  He has been icing which gives some relief    59 y.o. male presents with the above complaint.   ROS: Denies fever chills nausea vomiting muscle aches pains calf pain back pain chest pain shortness of breath.  Past Medical History:  Diagnosis Date  . Hypertension   . Kidney stones   . Wears contact lenses   . Wears dentures    full upper   Past Surgical History:  Procedure Laterality Date  . COLONOSCOPY WITH PROPOFOL N/A 09/09/2016   Procedure: COLONOSCOPY WITH PROPOFOL;  Surgeon: Lucilla Lame, MD;  Location: [facility];  Service: Endoscopy;  Laterality: N/A;  . FOOT SURGERY Left 2007    Current Outpatient Medications:  .  amLODipine (NORVASC) 10 MG tablet, TAKE 1 TABLET BY MOUTH EVERY DAY, Disp: 90 tablet, Rfl: 3 .  atorvastatin (LIPITOR) 10 MG tablet, TAKE 1 TABLET BY MOUTH EVERY DAY, Disp: 90 tablet, Rfl: 3 .  meloxicam (MOBIC) 15 MG tablet, Take 1 tablet (15 mg total) by mouth daily., Disp: 30 tablet, Rfl: 3 .  methylPREDNISolone (MEDROL DOSEPAK) 4 MG TBPK tablet, 6 day dose pack - take as directed, Disp: 21 tablet, Rfl: 0  No Known Allergies Review of Systems Objective:  There were no vitals filed for this visit.  General: Well developed, nourished, in no acute distress, alert and oriented x3   Dermatological: Skin is warm, dry and supple bilateral. Nails x 10 are well maintained; remaining integument appears unremarkable at this time. There are no open sores, no preulcerative lesions, no rash or signs of infection present.  Vascular: Dorsalis Pedis artery and Posterior Tibial artery pedal pulses are 2/4  bilateral with immedate capillary fill time. Pedal hair growth present. No varicosities and no lower extremity edema present bilateral.   Neruologic: Grossly intact via light touch bilateral. Vibratory intact via tuning fork bilateral. Protective threshold with Semmes Wienstein monofilament intact to all pedal sites bilateral. Patellar and Achilles deep tendon reflexes 2+ bilateral. No Babinski or clonus noted bilateral.   Musculoskeletal: No gross boney pedal deformities bilateral. No pain, crepitus, or limitation noted with foot and ankle range of motion bilateral. Muscular strength 5/5 in all groups tested bilateral.  Pain on palpation peroneal tendons with fluctuance beneath the tendon sheath.  Just inferior to the lateral malleolus there is tenderness on palpation.  Also has pain on palpation medial calcaneal tubercle of the right heel.  Gait: Unassisted, Nonantalgic.    Radiographs:  Radiographs taken today demonstrate an osseously mature individual soft tissue increase in density plantar calcaneal insertion site soft tissue swelling lateral aspect of the right ankle.  Assessment & Plan:   Assessment: Plan fasciitis right peroneal tendinitis right  Plan: Discussed etiology pathology conservative versus surgical therapy start him on Medrol Dosepak to be followed by meloxicam.  Injected the right heel with 20 mg Kenalog 5 mg Marcaine also injected the peroneal tendons with 2 mg of dexamethasone.  Placed on plantar fascial brace night splint and I will follow-up with him in 1 month discussed appropriate shoe gear stretching exercise ice therapy sugar modifications  and he was scanned for a new set of orthotics.     Aubrii Sharpless T. [place], [place]

## 2020-10-01 ENCOUNTER — Ambulatory Visit: Payer: Federal, State, Local not specified - PPO | Admitting: Orthotics

## 2020-10-01 ENCOUNTER — Other Ambulatory Visit: Payer: Self-pay

## 2020-10-01 NOTE — Progress Notes (Signed)
Patient f/o not in   Shipped to [place]

## 2020-10-22 ENCOUNTER — Ambulatory Visit: Payer: Federal, State, Local not specified - PPO | Admitting: Podiatry

## 2020-10-22 ENCOUNTER — Encounter: Payer: Self-pay | Admitting: Podiatry

## 2020-10-22 ENCOUNTER — Other Ambulatory Visit: Payer: Self-pay

## 2020-10-22 DIAGNOSIS — M7671 Peroneal tendinitis, right leg: Secondary | ICD-10-CM

## 2020-10-22 NOTE — Progress Notes (Signed)
He presents today states that the plantar fasciitis is doing much better but the peroneal area as he points to is still tender.  He is also here to pick up his orthotics today.  Objective: Vital signs stable alert oriented x3 pulses are palpable.  Still has tenderness on palpation medial calcaneal tubercle of the right heel but not nearly as tender as it was previously.  He still has fluctuance beneath and posterior to the lateral malleolus.  Tendon function is normal but it is considerable amount of fluctuance in that sheath.  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  Plan: I went ahead and injected the area again today with 10 mg of Kenalog 5 mg Marcaine to the area of fluctuance which is different from the previous area.  And also dispensed his orthotics.  I will follow-up with him in 3 to 4weeks.

## 2020-12-03 ENCOUNTER — Ambulatory Visit: Payer: Federal, State, Local not specified - PPO | Admitting: Podiatry

## 2020-12-03 ENCOUNTER — Encounter: Payer: Self-pay | Admitting: Podiatry

## 2020-12-03 ENCOUNTER — Other Ambulatory Visit: Payer: Self-pay

## 2020-12-03 DIAGNOSIS — M722 Plantar fascial fibromatosis: Secondary | ICD-10-CM

## 2020-12-03 DIAGNOSIS — M7671 Peroneal tendinitis, right leg: Secondary | ICD-10-CM

## 2020-12-03 MED ORDER — CELECOXIB 100 MG PO CAPS
100.0000 mg | ORAL_CAPSULE | Freq: Two times a day (BID) | ORAL | 3 refills | Status: DC
Start: 2020-12-03 — End: 2022-12-02

## 2020-12-03 MED ORDER — METHYLPREDNISOLONE 4 MG PO TBPK
ORAL_TABLET | ORAL | 0 refills | Status: DC
Start: 2020-12-03 — End: 2021-09-04

## 2020-12-03 MED ORDER — TRIAMCINOLONE ACETONIDE 40 MG/ML IJ SUSP
20.0000 mg | Freq: Once | INTRAMUSCULAR | Status: AC
  Administered 2020-12-03: 20 mg

## 2020-12-03 NOTE — Progress Notes (Signed)
Samuel Mitchell presents today for follow-up of his peroneal tendinitis and plantar fasciitis of the right foot.  He states that is doing much better but the fasciitis is now worse than the peroneal tendinitis.  He also noted notices some cramping in his second and third toes and he continues to wear his orthotics.  About 50% improved.  Objective: Pulses are palpable.  Neurologic sensorium is intact much decrease in fluctuance along the peroneal tendons of the right foot.  A slight increase in pain of the plantar fascial site right.  Assessment: Plantar fasciitis with compensatory peroneal tendinitis.  Plan: Discussed etiology pathology conservative versus surgical therapies.  I injected 20 mg Kenalog 5 mg Marcaine for maximal tenderness of the right heel.  Tolerated procedure well without complications.  Also started him on a Medrol Dosepak to be followed by Celebrex 100 mg twice a day follow-up with him in 1 month if necessary.

## 2021-01-14 ENCOUNTER — Ambulatory Visit: Payer: Federal, State, Local not specified - PPO | Admitting: Podiatry

## 2021-04-23 ENCOUNTER — Other Ambulatory Visit: Payer: Self-pay | Admitting: Physician Assistant

## 2021-04-23 DIAGNOSIS — I10 Essential (primary) hypertension: Secondary | ICD-10-CM

## 2021-04-23 DIAGNOSIS — E7800 Pure hypercholesterolemia, unspecified: Secondary | ICD-10-CM

## 2021-04-23 NOTE — Telephone Encounter (Signed)
  Notes to clinic: Patient states he needs to find out his work schedule and then he will call and schedule appt Review for a short supply of medication LOV was 2020  Requested Prescriptions  Pending Prescriptions Disp Refills   amLODipine (NORVASC) 10 MG tablet [Pharmacy Med Name: AMLODIPINE BESYLATE 10 MG TAB] 90 tablet 3    Sig: TAKE 1 TABLET BY MOUTH EVERY DAY      Cardiovascular:  Calcium Channel Blockers Failed - 04/23/2021  1:36 AM      Failed - Valid encounter within last 6 months    Recent Outpatient Visits           2 years ago Essential (primary) hypertension   [facility], [place], PA-C   3 years ago Dry cough   [place], [place], [place]   4 years ago Essential (primary) hypertension   [facility], [place], [place]   5 years ago Cough   [place], [place]                Passed - Last BP in normal range    BP Readings from Last 1 Encounters:  04/20/19 119/79            atorvastatin (LIPITOR) 10 MG tablet [Pharmacy Med Name: ATORVASTATIN 10 MG TABLET] 90 tablet 3    Sig: TAKE 1 TABLET BY MOUTH EVERY DAY      Cardiovascular:  Antilipid - Statins Failed - 04/23/2021  1:36 AM      Failed - Total Cholesterol in normal range and within 360 days    Cholesterol, Total  Date Value Ref Range Status  04/20/2019 210 (H) 100 - 199 mg/dL Final          Failed - LDL in normal range and within 360 days    LDL Calculated  Date Value Ref Range Status  04/20/2019 149 (H) 0 - 99 mg/dL Final          Failed - HDL in normal range and within 360 days    HDL  Date Value Ref Range Status  04/20/2019 36 (L) >39 mg/dL Final          Failed - Triglycerides in normal range and within 360 days    Triglycerides  Date Value Ref Range Status  04/20/2019 124 0 - 149 mg/dL Final          Failed - Valid encounter within last 12 months    Recent Outpatient Visits           2  years ago Essential (primary) hypertension   [facility], [place], [place]   3 years ago Dry cough   [place], [place], [place]   4 years ago Essential (primary) hypertension   [place], [place], [place]   5 years ago Cough   [facility] [place], [place] - Patient is not pregnant

## 2021-05-11 ENCOUNTER — Encounter: Payer: Self-pay | Admitting: Family Medicine

## 2021-05-11 ENCOUNTER — Ambulatory Visit: Payer: Federal, State, Local not specified - PPO | Admitting: Family Medicine

## 2021-05-11 ENCOUNTER — Other Ambulatory Visit: Payer: Self-pay

## 2021-05-11 VITALS — BP 121/86 | HR 83 | Temp 98.2°F | Resp 16 | Ht 70.0 in | Wt 233.0 lb

## 2021-05-11 DIAGNOSIS — R739 Hyperglycemia, unspecified: Secondary | ICD-10-CM | POA: Diagnosis not present

## 2021-05-11 DIAGNOSIS — I10 Essential (primary) hypertension: Secondary | ICD-10-CM

## 2021-05-11 DIAGNOSIS — E7800 Pure hypercholesterolemia, unspecified: Secondary | ICD-10-CM | POA: Diagnosis not present

## 2021-05-11 NOTE — Progress Notes (Signed)
I,April Samuel Mitchell,acting as a scribe for Samuel Durie, MD.,have documented all relevant documentation on the behalf of Samuel Durie, MD,as directed by  Samuel Durie, MD while in the presence of Samuel Durie, MD.   Established patient visit   Patient: Samuel Mitchell   DOB: [birth year]   60 y.o. Male  MRN: [id number] Visit Date: 05/11/2021  Today's healthcare provider: [name], MD   Chief Complaint  Patient presents with  . Follow-up  . Hypertension  . Hyperlipidemia   Subjective    HPI  Patient has been a Special educational needs teacher for 49 years and was in the [employer] for 3 years before that.  He has been married for 36 years and has 2 children 2 grandchildren and another grandchild on the way. Physically he feels well but has not been seen in 2 years but is some mildly getting his medication for blood pressure during that time. Hypertension, follow-up  BP Readings from Last 3 Encounters:  05/11/21 121/86  04/20/19 119/79  01/31/18 138/82   Wt Readings from Last 3 Encounters:  05/11/21 233 lb (105.7 kg)  04/20/19 221 lb 9.6 oz (100.5 kg)  01/31/18 223 lb (101.2 kg)     He was last seen for hypertension on 02/19/2019.  BP at that visit was 119/79. Management since that visit includes; on amlodipine. He reports poor compliance with treatment. He is not having side effects. none He is not exercising. He is not adherent to low salt diet.   Outside blood pressures are not checking.  He does not smoke.  Use of agents associated with hypertension: none.   -------------------------------------------------------------------- Lipid/Cholesterol, follow-up  Last Lipid Panel: Lab Results  Component Value Date   CHOL 210 (H) 04/20/2019   LDLCALC 149 (H) 04/20/2019   HDL 36 (L) 04/20/2019   TRIG 124 04/20/2019    He was last seen for this 04/20/2019.  Management since that visit includes; on atorvastatin.  He reports poor compliance with  treatment. He is not having side effects. none  He is following a Regular diet. Current exercise: walking  Last metabolic panel Lab Results  Component Value Date   GLUCOSE 84 04/20/2019   NA 140 04/20/2019   K 4.3 04/20/2019   BUN 12 04/20/2019   CREATININE 1.22 04/20/2019   GFRNONAA 66 04/20/2019   GFRAA 77 04/20/2019   CALCIUM 9.4 04/20/2019   AST 14 04/20/2019   ALT 16 04/20/2019   The 10-year ASCVD risk score Mikey Bussing DC Jr., et al., 2013) is: 9.7%  --------------------------------------------------------------------      Medications: Outpatient Medications Prior to Visit  Medication Sig  . amLODipine (NORVASC) 10 MG tablet TAKE 1 TABLET BY MOUTH EVERY DAY Please schedule an office visit before anymore refills.  Marland Kitchen atorvastatin (LIPITOR) 10 MG tablet Take 1 tablet (10 mg total) by mouth daily. Please schedule an office visit before anymore refills.  . celecoxib (CELEBREX) 100 MG capsule Take 1 capsule (100 mg total) by mouth 2 (two) times daily. (Patient not taking: Reported on 05/11/2021)  . methylPREDNISolone (MEDROL DOSEPAK) 4 MG TBPK tablet 6 day dose pack - take as directed (Patient not taking: Reported on 05/11/2021)   No facility-administered medications prior to visit.    Review of Systems      Objective    BP 121/86 (BP Location: Right Arm, Patient Position: Sitting, Cuff Size: Large)   Pulse 83   Temp 98.2 F (36.8 C) (Oral)   Resp  16   Ht 5\' 10"  (1.778 m)   Wt 233 lb (105.7 kg)   SpO2 96%   BMI 33.43 kg/m  BP Readings from Last 3 Encounters:  05/11/21 121/86  04/20/19 119/79  01/31/18 138/82   Wt Readings from Last 3 Encounters:  05/11/21 233 lb (105.7 kg)  04/20/19 221 lb 9.6 oz (100.5 kg)  01/31/18 223 lb (101.2 kg)       Physical Exam Vitals reviewed.  Constitutional:      General: He is not in acute distress.    Appearance: Normal appearance. He is well-developed. He is not ill-appearing or diaphoretic.  HENT:     Head: Normocephalic  and atraumatic.     Right Ear: External ear normal.     Left Ear: External ear normal.     Mouth/Throat:     Pharynx: Oropharynx is clear.  Eyes:     General: No scleral icterus.    Conjunctiva/sclera: Conjunctivae normal.  Neck:     Thyroid: No thyromegaly.     Vascular: No JVD.     Trachea: No tracheal deviation.  Cardiovascular:     Rate and Rhythm: Normal rate and regular rhythm.     Pulses: Normal pulses.     Heart sounds: Normal heart sounds. No murmur heard. No friction rub. No gallop.   Pulmonary:     Effort: Pulmonary effort is normal. No respiratory distress.     Breath sounds: Normal breath sounds. No wheezing or rales.  Musculoskeletal:     Cervical back: Normal range of motion and neck supple.     Right lower leg: No edema.     Left lower leg: No edema.  Lymphadenopathy:     Cervical: No cervical adenopathy.  Skin:    General: Skin is warm and dry.       Neurological:     General: No focal deficit present.     Mental Status: He is alert and oriented to person, place, and time.  Psychiatric:        Mood and Affect: Mood normal.        Behavior: Behavior normal.        Thought Content: Thought content normal.        Judgment: Judgment normal.       No results found for any visits on 05/11/21.  Assessment & Plan     1. Essential (primary) hypertension Well-controlled on amlodipine.  Physical at end of the year. - CBC w/Diff/Platelet - Comprehensive Metabolic Panel (CMET) - Lipid panel - TSH - Hemoglobin A1c  2. Pure hypercholesterolemia On atorvastatin.  Check labs. - CBC w/Diff/Platelet - Comprehensive Metabolic Panel (CMET) - Lipid panel - TSH - Hemoglobin A1c  3. Hyperglycemia Work on diet and exercise.  Check A1c. - CBC w/Diff/Platelet - Comprehensive Metabolic Panel (CMET) - Lipid panel - TSH - Hemoglobin A1c   No follow-ups on file.      I, Samuel Durie, MD, have reviewed all documentation for this visit. The  documentation on 05/12/21 for the exam, diagnosis, procedures, and orders are all accurate and complete.    Trenae Brunke Cranford Mon, MD  [facility] [phone] (phone) [phone] (fax)  [place]

## 2021-05-21 ENCOUNTER — Other Ambulatory Visit: Payer: Self-pay | Admitting: Family Medicine

## 2021-05-21 DIAGNOSIS — I10 Essential (primary) hypertension: Secondary | ICD-10-CM

## 2021-05-21 DIAGNOSIS — E7800 Pure hypercholesterolemia, unspecified: Secondary | ICD-10-CM

## 2021-09-04 ENCOUNTER — Emergency Department: Payer: Federal, State, Local not specified - PPO

## 2021-09-04 ENCOUNTER — Ambulatory Visit: Payer: Self-pay | Admitting: *Deleted

## 2021-09-04 ENCOUNTER — Emergency Department
Admission: EM | Admit: 2021-09-04 | Discharge: 2021-09-04 | Disposition: A | Discharge status: Home / Self Care | Payer: Federal, State, Local not specified - PPO | Attending: Emergency Medicine | Admitting: Emergency Medicine

## 2021-09-04 ENCOUNTER — Other Ambulatory Visit: Payer: Self-pay

## 2021-09-04 DIAGNOSIS — I10 Essential (primary) hypertension: Secondary | ICD-10-CM | POA: Diagnosis not present

## 2021-09-04 DIAGNOSIS — Z79899 Other long term (current) drug therapy: Secondary | ICD-10-CM | POA: Insufficient documentation

## 2021-09-04 DIAGNOSIS — R079 Chest pain, unspecified: Secondary | ICD-10-CM | POA: Diagnosis not present

## 2021-09-04 DIAGNOSIS — Z20822 Contact with and (suspected) exposure to covid-19: Secondary | ICD-10-CM | POA: Diagnosis not present

## 2021-09-04 DIAGNOSIS — R0789 Other chest pain: Secondary | ICD-10-CM | POA: Diagnosis not present

## 2021-09-04 DIAGNOSIS — R072 Precordial pain: Secondary | ICD-10-CM | POA: Diagnosis not present

## 2021-09-04 LAB — CBC
HCT: 42.3 % (ref 39.0–52.0)
Hemoglobin: 14.5 g/dL (ref 13.0–17.0)
MCH: 29 pg (ref 26.0–34.0)
MCHC: 34.3 g/dL (ref 30.0–36.0)
MCV: 84.6 fL (ref 80.0–100.0)
Platelets: 236 10*3/uL (ref 150–400)
RBC: 5 MIL/uL (ref 4.22–5.81)
RDW: 14.6 % (ref 11.5–15.5)
WBC: 6.1 10*3/uL (ref 4.0–10.5)
nRBC: 0 % (ref 0.0–0.2)

## 2021-09-04 LAB — BASIC METABOLIC PANEL
Anion gap: 8 (ref 5–15)
BUN: 13 mg/dL (ref 6–20)
CO2: 25 mmol/L (ref 22–32)
Calcium: 9.2 mg/dL (ref 8.9–10.3)
Chloride: 104 mmol/L (ref 98–111)
Creatinine, Ser: 1.05 mg/dL (ref 0.61–1.24)
GFR, Estimated: >60 mL/min (ref >60)
Glucose, Bld: 135 mg/dL — ABNORMAL HIGH (ref 70–99)
Potassium: 3.7 mmol/L (ref 3.5–5.1)
Sodium: 137 mmol/L (ref 135–145)

## 2021-09-04 LAB — RESP PANEL BY RT-PCR (FLU A&B, COVID) ARPGX2
Influenza A by PCR: NEGATIVE
Influenza B by PCR: NEGATIVE
SARS Coronavirus 2 by RT PCR: NEGATIVE

## 2021-09-04 LAB — TROPONIN I (HIGH SENSITIVITY)
Troponin I (High Sensitivity): 4 ng/L (ref <18)
Troponin I (High Sensitivity): 3 ng/L (ref <18)

## 2021-09-04 LAB — D-DIMER, QUANTITATIVE: D-Dimer, Quant: 0.3 ug/mL-FEU (ref 0.00–0.50)

## 2021-09-04 NOTE — ED Provider Notes (Signed)
[facility] Emergency Department Provider Note  ____________________________________________   Event Date/Time   First MD Initiated Contact with Patient 09/04/21 1052     (approximate)  I have reviewed the triage vital signs and the nursing notes.   HISTORY  Chief Complaint Chest Pain   HPI Samuel Mitchell is a 60 y.o. male with past medical history of HTN, HDL and kidney stones who presents accompanied by his wife for assessment of 2 to 3 days of some substernal chest pain that he describes as sharp that occurs when he is coughing.  He states he had a cough for several weeks or months but has not been coughing up anything.  His cough has not changed last couple days.  He states he is essentially pain-free between coughing episodes.  He denies any significant shortness of breath, nausea, vomiting, diarrhea, abdominal pain, back pain, rash, headache, earache, sore throat or other clear associated sick symptoms.  He denies any history of tobacco abuse or known cardiopulmonary pathology.  No other acute concerns at this time.         Past Medical History:  Diagnosis Date   Hypertension    Kidney stones    Wears contact lenses    Wears dentures    full upper    Patient Active Problem List   Diagnosis Date Noted   Special screening for malignant neoplasms, colon    Dermatitis, eczematoid 06/11/2015   HLD (hyperlipidemia) 06/11/2015   Essential (primary) hypertension 01/10/2015   Kidney stone 01/10/2015    Past Surgical History:  Procedure Laterality Date   COLONOSCOPY WITH PROPOFOL N/A 09/09/2016   Procedure: COLONOSCOPY WITH PROPOFOL;  Surgeon: Lucilla Lame, MD;  Location: [facility];  Service: Endoscopy;  Laterality: N/A;   FOOT SURGERY Left 2007    Prior to Admission medications   Medication Sig Start Date End Date Taking? Authorizing Provider  amLODipine (NORVASC) 10 MG tablet TAKE 1 TABLET BY MOUTH EVERY DAY PLEASE SCHEDULE AN  OFFICE VISIT BEFORE ANYMORE REFILLS. 05/21/21   Virginia Crews, MD  atorvastatin (LIPITOR) 10 MG tablet TAKE 1 TABLET (10 MG TOTAL) BY MOUTH DAILY. PLEASE SCHEDULE AN OFFICE VISIT BEFORE ANYMORE REFILLS. 05/21/21   Bacigalupo, Dionne Bucy, MD  celecoxib (CELEBREX) 100 MG capsule Take 1 capsule (100 mg total) by mouth 2 (two) times daily. Patient not taking: Reported on 05/11/2021 12/03/20   Garrel Ridgel, DPM    Allergies Patient has no known allergies.  Family History  Problem Relation Age of Onset   Alcohol abuse Father    Hypertension Brother     Social History Social History   Tobacco Use   Smoking status: Never   Smokeless tobacco: Never  Substance Use Topics   Alcohol use: No    Alcohol/week: 0.0 standard drinks    Review of Systems  Review of Systems  Constitutional:  Negative for chills and fever.  HENT:  Negative for sore throat.   Eyes:  Negative for pain.  Respiratory:  Positive for cough. Negative for stridor.   Cardiovascular:  Positive for chest pain.  Gastrointestinal:  Negative for vomiting.  Genitourinary:  Negative for dysuria.  Musculoskeletal:  Negative for myalgias.  Skin:  Negative for rash.  Neurological:  Negative for seizures, loss of consciousness and headaches.  Psychiatric/Behavioral:  Negative for suicidal ideas.   All other systems reviewed and are negative.    ____________________________________________   PHYSICAL EXAM:  VITAL SIGNS: ED Triage Vitals  Enc  Vitals Group     BP 09/04/21 0959 (!) 141/90     Pulse Rate 09/04/21 0959 84     Resp 09/04/21 0959 18     Temp 09/04/21 0959 98.4 F (36.9 C)     Temp src --      SpO2 09/04/21 0959 95 %     Weight --      Height --      Head Circumference --      Peak Flow --      Pain Score 09/04/21 0957 3     Pain Loc --      Pain Edu? --      Excl. in [place]? --    Vitals:   09/04/21 1130 09/04/21 1230  BP: 131/88 (!) 139/94  Pulse: 63 65  Resp: 18 15  Temp:    SpO2: 98% 98%    Physical Exam Vitals and nursing note reviewed.  Constitutional:      Appearance: He is well-developed.  HENT:     Head: Normocephalic and atraumatic.     Right Ear: External ear normal.     Left Ear: External ear normal.     Nose: Nose normal.  Eyes:     Conjunctiva/sclera: Conjunctivae normal.  Cardiovascular:     Rate and Rhythm: Normal rate and regular rhythm.     Heart sounds: No murmur heard. Pulmonary:     Effort: Pulmonary effort is normal. No respiratory distress.     Breath sounds: Normal breath sounds.  Abdominal:     Palpations: Abdomen is soft.     Tenderness: There is no abdominal tenderness.  Musculoskeletal:     Cervical back: Neck supple.  Skin:    General: Skin is warm and dry.     Capillary Refill: Capillary refill takes less than 2 seconds.  Neurological:     Mental Status: He is alert and oriented to person, place, and time.  Psychiatric:        Mood and Affect: Mood normal.     ____________________________________________   LABS (all labs ordered are listed, but only abnormal results are displayed)  Labs Reviewed  BASIC METABOLIC PANEL - Abnormal; Notable for the following components:      Result Value   Glucose, Bld 135 (*)    All other components within normal limits  RESP PANEL BY RT-PCR (FLU A&B, COVID) ARPGX2  CBC  D-DIMER, QUANTITATIVE  TROPONIN I (HIGH SENSITIVITY)  TROPONIN I (HIGH SENSITIVITY)   ____________________________________________  EKG  Sinus rhythm with a ventricular rate of 79, QRS interval of 156 with left bundle branch pattern and some nonspecific T wave changes in lead III without other clearance of acute ischemia or significant arrhythmia. ____________________________________________  RADIOLOGY  ED MD interpretation: Chest x-ray has no evidence of effusion, overt edema, pneumothorax, focal consolidation or other clear acute thoracic process.  Official radiology report(s): DG Chest 2 View  Result Date:  09/04/2021 CLINICAL DATA:  Chest pain. EXAM: CHEST - 2 VIEW COMPARISON:  None. FINDINGS: The heart size and mediastinal contours are within normal limits. Both lungs are clear. The visualized skeletal structures are unremarkable. IMPRESSION: No active cardiopulmonary disease. Electronically Signed   By: Marijo Conception M.D.   On: 09/04/2021 10:48    ____________________________________________   PROCEDURES  Procedure(s) performed (including Critical Care):  Procedures   ____________________________________________   INITIAL IMPRESSION / ASSESSMENT AND PLAN / ED COURSE      Patient presents with above to history exam for  assessment of some chest pain that occurs with coughing her last couple days.  This is in the setting of several weeks of nonproductive cough.  On arrival patient is afebrile hemodynamically stable.  Differential includes PE, pneumonia, bronchitis, ACS, arrhythmia, pericarditis, hepatitis, pleurisy and costochondritis.  Lower suspicion for GI etiology although GERD is potential within the differential as well.  ECG with some nonspecific changes but given nonelevated troponin x2 I have low suspicion for ACS or myocarditis.  No significant arrhythmia identified.  D-dimer is less than 0.5 and not consistent with PE.  COVID and influenza PCR sent.  Chest x-ray has no evidence of effusion, overt edema, pneumothorax, focal consolidation or other clear acute thoracic process.  CBC shows no leukocytosis or acute anemia.  BMP shows no significant electrolyte or metabolic derangements.  Suspect possible costochondritis versus some mild pleurisy from bronchitis versus other cause of chronic cough i.e. GERD or postnasal drip.  I think he is stable for discharge with close outpatient PCP follow-up.  Discharged stable condition.  Strict return precautions advised and discussed.     ____________________________________________   FINAL CLINICAL IMPRESSION(S) / ED DIAGNOSES  Final  diagnoses:  Chest pain, unspecified type    Medications - No data to display   ED Discharge Orders     None        Note:  This document was prepared using Dragon voice recognition software and may include unintentional dictation errors.    Lucrezia Starch, MD 09/04/21 1321

## 2021-09-04 NOTE — Telephone Encounter (Signed)
Dull ache left sided near sternum began yesterday. Not related to movement of meals. Constant burning ache this morning, no reflux/sour taste. No SOB reported but feels very uneasy. Per protocol-advised ED at this time. Took Naproxen at 9:25a. No antacids as of yet-none on hand. Patient agreed to plan.He will call afterwards for follow-up visit. Reason for Disposition  [1] Chest pain (or "angina") comes and goes AND [2] is happening more often (increasing in frequency) or getting worse (increasing in severity) (Exception: chest pains that last only a few seconds)  Answer Assessment - Initial Assessment Questions 1. LOCATION: "Where does it hurt?"       Dull ache with a cough on left side close to sternum 2. RADIATION: "Does the pain go anywhere else?" (e.g., into neck, jaw, arms, back)     Occasional to left armpit 3. ONSET: "When did the chest pain begin?" (Minutes, hours or days)      About 2 days ago 4. PATTERN "Does the pain come and go, or has it been constant since it started?"  "Does it get worse with exertion?"      Comes and goes. This morning once moving around noticed an achy burn. 5. DURATION: "How long does it last" (e.g., seconds, minutes, hours)     Lasting about 25 minutes now 6. SEVERITY: "How bad is the pain?"  (e.g., Scale 1-10; mild, moderate, or severe)    - MILD (1-3): doesn't interfere with normal activities     - MODERATE (4-7): interferes with normal activities or awakens from sleep    - SEVERE (8-10): excruciating pain, unable to do any normal activities       2 7. CARDIAC RISK FACTORS: "Do you have any history of heart problems or risk factors for heart disease?" (e.g., angina, prior heart attack; diabetes, high blood pressure, high cholesterol, smoker, or strong family history of heart disease)     Yes 8. PULMONARY RISK FACTORS: "Do you have any history of lung disease?"  (e.g., blood clots in lung, asthma, emphysema, birth control pills)     no 9. CAUSE: "What do  you think is causing the chest pain?"     unknown 10. OTHER SYMPTOMS: "Do you have any other symptoms?" (e.g., dizziness, nausea, vomiting, sweating, fever, difficulty breathing, cough)       Occasional cough 11. PREGNANCY: "Is there any chance you are pregnant?" "When was your last menstrual period?"       na  Protocols used: Chest Pain-A-AH

## 2021-09-04 NOTE — ED Triage Notes (Signed)
Pt comes with c/o CP that started few days ago. Pt states it is worse when he coughs and hurts under his ribs as well. Pt states some radiation to left arm  Pt states more burping than usual. Pt denies any SOB, N/V.D

## 2021-09-08 ENCOUNTER — Ambulatory Visit: Payer: Federal, State, Local not specified - PPO | Admitting: Family Medicine

## 2021-09-08 ENCOUNTER — Encounter: Payer: Self-pay | Admitting: *Deleted

## 2021-09-08 ENCOUNTER — Other Ambulatory Visit: Payer: Self-pay

## 2021-09-08 ENCOUNTER — Encounter: Payer: Self-pay | Admitting: Family Medicine

## 2021-09-08 VITALS — BP 128/85 | HR 79 | Temp 97.6°F | Resp 16 | Ht 70.0 in | Wt 231.0 lb

## 2021-09-08 DIAGNOSIS — R0789 Other chest pain: Secondary | ICD-10-CM

## 2021-09-08 MED ORDER — PREDNISONE 20 MG PO TABS: 20.0000 mg | ORAL_TABLET | Freq: Every day | ORAL | 0 refills | Status: DC

## 2021-09-08 NOTE — Progress Notes (Signed)
I,[name],acting as a scribe for [name], MD.,have documented all relevant documentation on the behalf of [name], MD,as directed by  [name], MD while in the presence of [name], MD.   Established patient visit   Patient: Samuel Mitchell   DOB: August 09, 1961   60 y.o. Male  MRN: [id number] Visit Date: 09/08/2021  Today's healthcare provider: [name], MD   Chief Complaint  Patient presents with   Hospitalization Follow-up   Subjective    HPI  Patient comes in today for follow-up of chest pain seen in the ED.  Work-up was negative. Patient thought to have costochondritis.  Aggravated by coughing.  Chest x-ray was negative. He is feeling better. Follow up ER visit   Patient was seen in ER for Chest pain on 09/01/2021. He was treated for Chest pain. Treatment for this included see notes in chart. He reports good compliance with treatment. He reports this condition is Improved.  -----------------------------------------------------------------------------------------  Patient states he has improved somewhat. He is still having slight chest occasionally.     Medications: Outpatient Medications Prior to Visit  Medication Sig   amLODipine (NORVASC) 10 MG tablet TAKE 1 TABLET BY MOUTH EVERY DAY PLEASE SCHEDULE AN OFFICE VISIT BEFORE ANYMORE REFILLS.   atorvastatin (LIPITOR) 10 MG tablet TAKE 1 TABLET (10 MG TOTAL) BY MOUTH DAILY. PLEASE SCHEDULE AN OFFICE VISIT BEFORE ANYMORE REFILLS.   celecoxib (CELEBREX) 100 MG capsule Take 1 capsule (100 mg total) by mouth 2 (two) times daily. (Patient not taking: No sig reported)   No facility-administered medications prior to visit.    Review of Systems  Constitutional:  Negative for appetite change, chills and fever.  Respiratory:  Negative for chest tightness, shortness of breath and wheezing.   Cardiovascular:  Negative for chest pain and palpitations.  Gastrointestinal:   Negative for abdominal pain, nausea and vomiting.       Objective    BP 128/85 (BP Location: Right Arm, Patient Position: Sitting, Cuff Size: Large)   Pulse 79   Temp 97.6 F (36.4 C) (Oral)   Resp 16   Ht '5\' 10"'$  (1.778 m)   Wt 231 lb (104.8 kg)   SpO2 96%   BMI 33.15 kg/m  BP Readings from Last 3 Encounters:  09/08/21 128/85  09/04/21 (!) 132/97  05/11/21 121/86   Wt Readings from Last 3 Encounters:  09/08/21 231 lb (104.8 kg)  05/11/21 233 lb (105.7 kg)  04/20/19 221 lb 9.6 oz (100.5 kg)      Physical Exam Vitals reviewed.  Constitutional:      General: He is not in acute distress.    Appearance: Normal appearance. He is well-developed. He is obese. He is not ill-appearing or diaphoretic.  HENT:     Head: Normocephalic and atraumatic.     Right Ear: Hearing and external ear normal.     Left Ear: Hearing and external ear normal.     Nose: Nose normal.     Mouth/Throat:     Pharynx: Oropharynx is clear.  Eyes:     General: Lids are normal. No scleral icterus.       Right eye: No discharge.        Left eye: No discharge.     Conjunctiva/sclera: Conjunctivae normal.  Neck:     Thyroid: No thyromegaly.     Vascular: No JVD.     Trachea: No tracheal deviation.  Cardiovascular:  Rate and Rhythm: Normal rate and regular rhythm.     Pulses: Normal pulses.     Heart sounds: Normal heart sounds. No murmur heard.   No friction rub. No gallop.  Pulmonary:     Effort: Pulmonary effort is normal. No respiratory distress.     Breath sounds: Normal breath sounds. No wheezing or rales.  Abdominal:     Palpations: Abdomen is soft.  Musculoskeletal:     Cervical back: Normal range of motion and neck supple.     Right lower leg: No edema.     Left lower leg: No edema.  Lymphadenopathy:     Cervical: No cervical adenopathy.  Skin:    General: Skin is warm and dry.     Findings: No lesion or rash.       Neurological:     General: No focal deficit present.      Mental Status: He is alert and oriented to person, place, and time.  Psychiatric:        Mood and Affect: Mood normal.        Speech: Speech normal.        Behavior: Behavior normal.        Thought Content: Thought content normal.        Judgment: Judgment normal.      No results found for any visits on 09/08/21.  Assessment & Plan     1. Other chest pain Cardiac chest pain. - predniSONE (DELTASONE) 20 MG tablet; Take 1 tablet (20 mg total) by mouth daily with breakfast.  Dispense: 5 tablet; Refill: 0  2. Costochondral chest pain Costochondritis most likely issue. - predniSONE (DELTASONE) 20 MG tablet; Take 1 tablet (20 mg total) by mouth daily with breakfast.  Dispense: 5 tablet; Refill: 0   No follow-ups on file.      I, [name], MD, have reviewed all documentation for this visit. The documentation on 09/13/21 for the exam, diagnosis, procedures, and orders are all accurate and complete.    Iqra Rotundo Cranford Mon, MD  [facility] [phone] (phone) [phone] (fax)  [place]

## 2021-11-11 ENCOUNTER — Encounter: Payer: Self-pay | Admitting: Family Medicine

## 2021-11-11 ENCOUNTER — Ambulatory Visit (INDEPENDENT_AMBULATORY_CARE_PROVIDER_SITE_OTHER): Payer: Federal, State, Local not specified - PPO | Admitting: Family Medicine

## 2021-11-11 ENCOUNTER — Other Ambulatory Visit: Payer: Self-pay

## 2021-11-11 VITALS — BP 133/88 | HR 87 | Temp 98.3°F | Ht 70.0 in | Wt 232.6 lb

## 2021-11-11 DIAGNOSIS — R739 Hyperglycemia, unspecified: Secondary | ICD-10-CM | POA: Diagnosis not present

## 2021-11-11 DIAGNOSIS — Z0000 Encounter for general adult medical examination without abnormal findings: Secondary | ICD-10-CM

## 2021-11-11 DIAGNOSIS — L570 Actinic keratosis: Secondary | ICD-10-CM

## 2021-11-11 DIAGNOSIS — I10 Essential (primary) hypertension: Secondary | ICD-10-CM | POA: Diagnosis not present

## 2021-11-11 DIAGNOSIS — Z125 Encounter for screening for malignant neoplasm of prostate: Secondary | ICD-10-CM

## 2021-11-11 DIAGNOSIS — E7800 Pure hypercholesterolemia, unspecified: Secondary | ICD-10-CM

## 2021-11-11 NOTE — Progress Notes (Signed)
Complete physical exam   Patient: Samuel Mitchell   DOB: Aug 26, 1961   60 y.o. Male  MRN: [id number] Visit Date: 11/11/2021  Today's healthcare provider: [name], MD   Chief Complaint  Patient presents with   Annual Exam   Subjective    Samuel Mitchell is a 60 y.o. male who presents today for a complete physical exam.  He reports consuming a general diet. The patient does not participate in regular exercise at present. He generally feels well. He reports sleeping fairly well. He does not have additional problems to discuss today.  Patient plans to retire in a year and a half.  He is married and has 2 children and will soon have his fourth grandchild.  Overall he feels pretty well. HPI    Past Medical History:  Diagnosis Date   Hypertension    Kidney stones    Wears contact lenses    Wears dentures    full upper   Past Surgical History:  Procedure Laterality Date   COLONOSCOPY WITH PROPOFOL N/A 09/09/2016   Procedure: COLONOSCOPY WITH PROPOFOL;  Surgeon: Lucilla Lame, MD;  Location: [facility];  Service: Endoscopy;  Laterality: N/A;   FOOT SURGERY Left 2007   Social History   Socioeconomic History   Marital status: Married    Spouse name: Not on file   Number of children: Not on file   Years of education: Not on file   Highest education level: Not on file  Occupational History   Not on file  Tobacco Use   Smoking status: Never   Smokeless tobacco: Never  Substance and Sexual Activity   Alcohol use: No    Alcohol/week: 0.0 standard drinks   Drug use: Not on file   Sexual activity: Not on file  Other Topics Concern   Not on file  Social History Narrative   Not on file   Social Determinants of Health   Financial Resource Strain: Not on file  Food Insecurity: Not on file  Transportation Needs: Not on file  Physical Activity: Not on file  Stress: Not on file  Social Connections: Not on file  Intimate Partner Violence: Not on file    Family Status  Relation Name Status   Mother  Alive   Father  Deceased at age 63       MI   Brother  Alive   PGF  Deceased at age 60       unknown causes   Family History  Problem Relation Age of Onset   Alcohol abuse Father    Hypertension Brother    No Known Allergies  Patient Care Team: Gwyneth Sprout, FNP as PCP - General (Family Medicine)   Medications: Outpatient Medications Prior to Visit  Medication Sig   amLODipine (NORVASC) 10 MG tablet TAKE 1 TABLET BY MOUTH EVERY DAY PLEASE SCHEDULE AN OFFICE VISIT BEFORE ANYMORE REFILLS.   atorvastatin (LIPITOR) 10 MG tablet TAKE 1 TABLET (10 MG TOTAL) BY MOUTH DAILY. PLEASE SCHEDULE AN OFFICE VISIT BEFORE ANYMORE REFILLS.   celecoxib (CELEBREX) 100 MG capsule Take 1 capsule (100 mg total) by mouth 2 (two) times daily. (Patient not taking: No sig reported)   predniSONE (DELTASONE) 20 MG tablet Take 1 tablet (20 mg total) by mouth daily with breakfast. (Patient not taking: Reported on 11/11/2021)   No facility-administered medications prior to visit.    Review of Systems  All other systems reviewed and are negative.  Objective    BP 133/88 (BP Location: Right Arm, Patient Position: Sitting, Cuff Size: Normal)   Pulse 87   Temp 98.3 F (36.8 C) (Oral)   Ht 5\' 10"  (1.778 m)   Wt 232 lb 9.6 oz (105.5 kg)   SpO2 100%   BMI 33.37 kg/m  BP Readings from Last 3 Encounters:  11/11/21 133/88  09/08/21 128/85  09/04/21 (!) 132/97   Wt Readings from Last 3 Encounters:  11/11/21 232 lb 9.6 oz (105.5 kg)  09/08/21 231 lb (104.8 kg)  05/11/21 233 lb (105.7 kg)      Physical Exam Vitals reviewed.  Constitutional:      General: He is not in acute distress.    Appearance: Normal appearance. He is well-developed. He is obese. He is not ill-appearing or diaphoretic.  HENT:     Head: Normocephalic and atraumatic.     Right Ear: Hearing and external ear normal.     Left Ear: Hearing and external ear normal.     Nose:  Nose normal.     Mouth/Throat:     Pharynx: Oropharynx is clear.  Eyes:     General: Lids are normal. No scleral icterus.       Right eye: No discharge.        Left eye: No discharge.     Conjunctiva/sclera: Conjunctivae normal.  Neck:     Thyroid: No thyromegaly.     Vascular: No JVD.     Trachea: No tracheal deviation.  Cardiovascular:     Rate and Rhythm: Normal rate and regular rhythm.     Pulses: Normal pulses.     Heart sounds: Normal heart sounds. No murmur heard.   No friction rub. No gallop.  Pulmonary:     Effort: Pulmonary effort is normal. No respiratory distress.     Breath sounds: Normal breath sounds. No wheezing or rales.  Abdominal:     Palpations: Abdomen is soft.  Musculoskeletal:     Cervical back: Normal range of motion and neck supple.     Right lower leg: No edema.     Left lower leg: No edema.  Lymphadenopathy:     Cervical: No cervical adenopathy.  Skin:    General: Skin is warm and dry.     Findings: No lesion or rash.       Neurological:     General: No focal deficit present.     Mental Status: He is alert and oriented to person, place, and time.  Psychiatric:        Mood and Affect: Mood normal.        Speech: Speech normal.        Behavior: Behavior normal.        Thought Content: Thought content normal.        Judgment: Judgment normal.      Last depression screening scores PHQ 2/9 Scores 11/11/2021  PHQ - 2 Score 0  PHQ- 9 Score 0   Last fall risk screening Fall Risk  11/11/2021  Falls in the past year? 0  Number falls in past yr: 0  Injury with Fall? 0  Risk for fall due to : No Fall Risks   Last Audit-C alcohol use screening Alcohol Use Disorder Test (AUDIT) 11/11/2021  1. How often do you have a drink containing alcohol? 0  3. How often do you have six or more drinks on one occasion? 0   A score of 3 or more in women, and 4  or more in men indicates increased risk for alcohol abuse, EXCEPT if all of the points are from  question 1   No results found for any visits on 11/11/21.  Assessment & Plan    Routine Health Maintenance and Physical Exam  Exercise Activities and Dietary recommendations  Goals   None     Immunization History  Administered Date(s) Administered   Td 02/28/2003   Tdap 06/13/2015    Health Maintenance  Topic Date Due   COVID-19 Vaccine (1) Never done   Zoster Vaccines- Shingrix (1 of 2) Never done   INFLUENZA VACCINE  Never done   TETANUS/TDAP  06/12/2025   COLONOSCOPY (Pts 45-50yrs Insurance coverage will need to be confirmed)  09/09/2026   Hepatitis C Screening  Completed   HIV Screening  Completed   Pneumococcal Vaccine 57-63 Years old  Aged Out   HPV VACCINES  Aged Out    Discussed health benefits of physical activity, and encouraged him to engage in regular exercise appropriate for his age and condition.  1. Annual physical exam Diet exercise and weight loss risk for his overall health. - Lipid panel - TSH - CBC w/Diff/Platelet - Comprehensive Metabolic Panel (CMET) - Hemoglobin A1c  2. Essential (primary) hypertension  - Lipid panel - TSH - CBC w/Diff/Platelet - Comprehensive Metabolic Panel (CMET) - Hemoglobin A1c  3. Pure hypercholesterolemia  - Lipid panel - TSH - CBC w/Diff/Platelet - Comprehensive Metabolic Panel (CMET) - Hemoglobin A1c  4. Hyperglycemia  - Lipid panel - TSH - CBC w/Diff/Platelet - Comprehensive Metabolic Panel (CMET) - Hemoglobin A1c  5. Screening for prostate cancer  - PSA  6. AK (actinic keratosis) For to Derm - Ambulatory referral to Dermatology   Return in about 6 months (around 05/11/2022).     I, [name], MD, have reviewed all documentation for this visit. The documentation on 11/14/21 for the exam, diagnosis, procedures, and orders are all accurate and complete.    Romone Shaff Cranford Mon, MD  [facility] [phone] (phone) [phone] (fax)  [place]

## 2021-11-13 ENCOUNTER — Other Ambulatory Visit: Payer: Self-pay | Admitting: Family Medicine

## 2021-11-13 DIAGNOSIS — E7800 Pure hypercholesterolemia, unspecified: Secondary | ICD-10-CM

## 2021-11-13 DIAGNOSIS — I10 Essential (primary) hypertension: Secondary | ICD-10-CM

## 2021-11-14 NOTE — Telephone Encounter (Signed)
Requested medications are due for refill today yes  Requested medications are on the active medication list yes  Last refill 08/17/21  Last visit 11/11/21  Future visit scheduled 05/11/22  Notes to clinic labs were ordered yesterday but not drawn yet, please assess. Requested Prescriptions  Pending Prescriptions Disp Refills   atorvastatin (LIPITOR) 10 MG tablet [Pharmacy Med Name: ATORVASTATIN 10 MG TABLET] 90 tablet 1    Sig: TAKE 1 TABLET (10 MG TOTAL) BY MOUTH DAILY. PLEASE SCHEDULE AN OFFICE VISIT BEFORE ANYMORE REFILLS.     Cardiovascular:  Antilipid - Statins Failed - 11/13/2021  6:14 PM      Failed - Total Cholesterol in normal range and within 360 days    Cholesterol, Total  Date Value Ref Range Status  04/20/2019 210 (H) 100 - 199 mg/dL Final          Failed - LDL in normal range and within 360 days    LDL Calculated  Date Value Ref Range Status  04/20/2019 149 (H) 0 - 99 mg/dL Final          Failed - HDL in normal range and within 360 days    HDL  Date Value Ref Range Status  04/20/2019 36 (L) >39 mg/dL Final          Failed - Triglycerides in normal range and within 360 days    Triglycerides  Date Value Ref Range Status  04/20/2019 124 0 - 149 mg/dL Final          Passed - Patient is not pregnant      Passed - Valid encounter within last 12 months    Recent Outpatient Visits           3 days ago Annual physical exam   [facility] Jerrol Banana., MD   2 months ago Other chest pain   [facility] Jerrol Banana., MD   6 months ago Essential (primary) hypertension   [facility] Jerrol Banana., MD   2 years ago Essential (primary) hypertension   [place], [place], [place]   3 years ago Dry cough   [place], [place]       Future Appointments             In 5 months Jerrol Banana., MD [facility]    In 6 months Ralene Bathe, MD [place]             amLODipine ([place]) 10 MG tablet [Pharmacy Med Name: AMLODIPINE BESYLATE 10 MG TAB] 90 tablet 1    Sig: TAKE 1 TABLET BY MOUTH EVERY DAY PLEASE SCHEDULE AN OFFICE VISIT BEFORE ANYMORE REFILLS.     Cardiovascular:  Calcium Channel Blockers Passed - 11/13/2021  6:14 PM      Passed - Last BP in normal range    BP Readings from Last 1 Encounters:  11/11/21 133/88          Passed - Valid encounter within last 6 months    Recent Outpatient Visits           3 days ago Annual physical exam   [facility] Jerrol Banana., MD   2 months ago Other chest pain   [facility] Jerrol Banana., MD   6 months ago Essential (primary) hypertension   [facility] Jerrol Banana., MD   2  years ago Essential (primary) hypertension   [place], [place], [place]   3 years ago Dry cough   [place], [place]       Future Appointments             In 5 months Jerrol Banana., MD [facility], [place]   In 6 months Ralene Bathe, MD [place]

## 2021-11-17 DIAGNOSIS — Z125 Encounter for screening for malignant neoplasm of prostate: Secondary | ICD-10-CM | POA: Diagnosis not present

## 2021-11-17 DIAGNOSIS — R739 Hyperglycemia, unspecified: Secondary | ICD-10-CM | POA: Diagnosis not present

## 2021-11-17 DIAGNOSIS — E7800 Pure hypercholesterolemia, unspecified: Secondary | ICD-10-CM | POA: Diagnosis not present

## 2021-11-17 DIAGNOSIS — Z0000 Encounter for general adult medical examination without abnormal findings: Secondary | ICD-10-CM | POA: Diagnosis not present

## 2021-11-17 DIAGNOSIS — I10 Essential (primary) hypertension: Secondary | ICD-10-CM | POA: Diagnosis not present

## 2021-11-18 LAB — CBC WITH DIFFERENTIAL/PLATELET
Basophils Absolute: 0.1 10*3/uL (ref 0.0–0.2)
Basos: 1 %
EOS (ABSOLUTE): 0.2 10*3/uL (ref 0.0–0.4)
Eos: 3 %
Hematocrit: 44.6 % (ref 37.5–51.0)
Hemoglobin: 14.7 g/dL (ref 13.0–17.7)
Immature Grans (Abs): 0 10*3/uL (ref 0.0–0.1)
Immature Granulocytes: 0 %
Lymphocytes Absolute: 1.8 10*3/uL (ref 0.7–3.1)
Lymphs: 27 %
MCH: 28.5 pg (ref 26.6–33.0)
MCHC: 33 g/dL (ref 31.5–35.7)
MCV: 87 fL (ref 79–97)
Monocytes Absolute: 0.8 10*3/uL (ref 0.1–0.9)
Monocytes: 11 %
Neutrophils Absolute: 3.9 10*3/uL (ref 1.4–7.0)
Neutrophils: 58 %
Platelets: 247 10*3/uL (ref 150–450)
RBC: 5.15 x10E6/uL (ref 4.14–5.80)
RDW: 13.2 % (ref 11.6–15.4)
WBC: 6.7 10*3/uL (ref 3.4–10.8)

## 2021-11-18 LAB — LIPID PANEL
Chol/HDL Ratio: 3.5 ratio (ref 0.0–5.0)
Cholesterol, Total: 142 mg/dL (ref 100–199)
HDL: 41 mg/dL (ref >39)
LDL Chol Calc (NIH): 88 mg/dL (ref 0–99)
Triglycerides: 64 mg/dL (ref 0–149)
VLDL Cholesterol Cal: 13 mg/dL (ref 5–40)

## 2021-11-18 LAB — COMPREHENSIVE METABOLIC PANEL
ALT: 11 IU/L (ref 0–44)
AST: 14 IU/L (ref 0–40)
Albumin/Globulin Ratio: 1.6 (ref 1.2–2.2)
Albumin: 4.1 g/dL (ref 3.8–4.9)
Alkaline Phosphatase: 99 IU/L (ref 44–121)
BUN/Creatinine Ratio: 13 (ref 9–20)
BUN: 16 mg/dL (ref 6–24)
Bilirubin Total: 0.5 mg/dL (ref 0.0–1.2)
CO2: 24 mmol/L (ref 20–29)
Calcium: 9.2 mg/dL (ref 8.7–10.2)
Chloride: 104 mmol/L (ref 96–106)
Creatinine, Ser: 1.19 mg/dL (ref 0.76–1.27)
Globulin, Total: 2.6 g/dL (ref 1.5–4.5)
Glucose: 111 mg/dL — ABNORMAL HIGH (ref 70–99)
Potassium: 4.1 mmol/L (ref 3.5–5.2)
Sodium: 139 mmol/L (ref 134–144)
Total Protein: 6.7 g/dL (ref 6.0–8.5)
eGFR: 71 mL/min/{1.73_m2} (ref >59)

## 2021-11-18 LAB — HEMOGLOBIN A1C
Est. average glucose Bld gHb Est-mCnc: 128 mg/dL
Hgb A1c MFr Bld: 6.1 % — ABNORMAL HIGH (ref 4.8–5.6)

## 2021-11-18 LAB — TSH: TSH: 3.54 u[IU]/mL (ref 0.450–4.500)

## 2021-11-18 LAB — PSA: Prostate Specific Ag, Serum: 1.8 ng/mL (ref 0.0–4.0)

## 2022-05-11 ENCOUNTER — Ambulatory Visit: Payer: Federal, State, Local not specified - PPO | Admitting: Family Medicine

## 2022-05-13 ENCOUNTER — Other Ambulatory Visit: Payer: Self-pay | Admitting: Family Medicine

## 2022-05-13 DIAGNOSIS — I10 Essential (primary) hypertension: Secondary | ICD-10-CM

## 2022-05-13 DIAGNOSIS — E7800 Pure hypercholesterolemia, unspecified: Secondary | ICD-10-CM

## 2022-05-20 ENCOUNTER — Ambulatory Visit: Payer: Federal, State, Local not specified - PPO | Admitting: Dermatology

## 2022-05-20 DIAGNOSIS — L570 Actinic keratosis: Secondary | ICD-10-CM

## 2022-05-20 DIAGNOSIS — L821 Other seborrheic keratosis: Secondary | ICD-10-CM

## 2022-05-20 DIAGNOSIS — L820 Inflamed seborrheic keratosis: Secondary | ICD-10-CM | POA: Diagnosis not present

## 2022-05-20 DIAGNOSIS — L578 Other skin changes due to chronic exposure to nonionizing radiation: Secondary | ICD-10-CM

## 2022-05-20 MED ORDER — FLUOROURACIL 5 % EX CREA
TOPICAL_CREAM | Freq: Two times a day (BID) | CUTANEOUS | 1 refills | Status: DC
Start: 2022-05-20 — End: 2022-12-02

## 2022-05-20 NOTE — Patient Instructions (Addendum)
Cryotherapy Aftercare  Wash gently with soap and water everyday.   Apply Vaseline and Band-Aid daily until healed.   In one month -  Start 5-fluorouracil/calcipotriene cream twice a day for 7 days to affected areas including forehead, scalp. Prescription sent to [facility]. Patient provided with contact information for pharmacy and advised the pharmacy will mail the prescription to their home. Patient provided with handout reviewing treatment course and side effects and advised to call or message [place] on MyChart with any concerns.  5-Fluorouracil/Calcipotriene Patient Education   Actinic keratoses are the dry, red scaly spots on the skin caused by sun damage. A portion of these spots can turn into skin cancer with time, and treating them can help prevent development of skin cancer.   Treatment of these spots requires removal of the defective skin cells. There are various ways to remove actinic keratoses, including freezing with liquid nitrogen, treatment with creams, or treatment with a blue light procedure in the office.   5-fluorouracil cream is a topical cream used to treat actinic keratoses. It works by interfering with the growth of abnormal fast-growing skin cells, such as actinic keratoses. These cells peel off and are replaced by healthy ones.   5-fluorouracil/calcipotriene is a combination of the 5-fluorouracil cream with a vitamin D analog cream called calcipotriene. The calcipotriene alone does not treat actinic keratoses. However, when it is combined with 5-fluorouracil, it helps the 5-fluorouracil treat the actinic keratoses much faster so that the same results can be achieved with a much shorter treatment time.  INSTRUCTIONS FOR 5-FLUOROURACIL/CALCIPOTRIENE CREAM:   5-fluorouracil/calcipotriene cream typically only needs to be used for 4-7 days. A thin layer should be applied twice a day to the treatment areas recommended by your physician.   If your physician prescribed you  separate tubes of 5-fluourouracil and calcipotriene, apply a thin layer of 5-fluorouracil followed by a thin layer of calcipotriene.   Avoid contact with your eyes, nostrils, and mouth. Do not use 5-fluorouracil/calcipotriene cream on infected or open wounds.   You will develop redness, irritation and some crusting at areas where you have pre-cancer damage/actinic keratoses. IF YOU DEVELOP PAIN, BLEEDING, OR SIGNIFICANT CRUSTING, STOP THE TREATMENT EARLY - you have already gotten a good response and the actinic keratoses should clear up well.  Wash your hands after applying 5-fluorouracil 5% cream on your skin.   A moisturizer or sunscreen with a minimum SPF 30 should be applied each morning.   Once you have finished the treatment, you can apply a thin layer of Vaseline twice a day to irritated areas to soothe and calm the areas more quickly. If you experience significant discomfort, contact your physician.  For some patients it is necessary to repeat the treatment for best results.  SIDE EFFECTS: When using 5-fluorouracil/calcipotriene cream, you may have mild irritation, such as redness, dryness, swelling, or a mild burning sensation. This usually resolves within 2 weeks. The more actinic keratoses you have, the more redness and inflammation you can expect during treatment. Eye irritation has been reported rarely. If this occurs, please let us know.  If you have any trouble using this cream, please call the office. If you have any other questions about this information, please do not hesitate to ask me before you leave the office.  Recommend daily broad spectrum sunscreen SPF 30+ to sun-exposed areas, reapply every 2 hours as needed. Call for new or changing lesions.  Staying in the shade or wearing long sleeves, sun glasses (UVA+UVB protection) and  wide brim hats (4-inch brim around the entire circumference of the hat) are also recommended for sun protection.   If You Need Anything After Your  Visit  If you have any questions or concerns for your doctor, please call our main line at [phone] and press option 4 to reach your doctor's medical assistant. If no one answers, please leave a voicemail as directed and we will return your call as soon as possible. Messages left after 4 pm will be answered the following business day.   You may also send [place] a message via [place]. We typically respond to MyChart messages within 1-2 business days.  For prescription refills, please ask your pharmacy to contact our office. Our fax number is [phone].  If you have an urgent issue when the clinic is closed that cannot wait until the next business day, you can page your doctor at the number below.    Please note that while we do our best to be available for urgent issues outside of office hours, we are not available 24/7.   If you have an urgent issue and are unable to reach [place], you may choose to seek medical care at your doctor's office, retail clinic, urgent care center, or emergency room.  If you have a medical emergency, please immediately call 911 or go to the emergency department.  Pager Numbers  - Dr. Nehemiah Massed: [phone]  - Dr. Laurence Ferrari: [phone]  - Dr. Nicole Kindred: [phone]  In the event of inclement weather, please call our main line at [phone] for an update on the status of any delays or closures.  Dermatology Medication Tips: Please keep the boxes that topical medications come in in order to help keep track of the instructions about where and how to use these. Pharmacies typically print the medication instructions only on the boxes and not directly on the medication tubes.   If your medication is too expensive, please contact our office at [phone] option 4 or send [place] a message through [place].   We are unable to tell what your co-pay for medications will be in advance as this is different depending on your insurance coverage. However, we may be able to find a  substitute medication at lower cost or fill out paperwork to get insurance to cover a needed medication.   If a prior authorization is required to get your medication covered by your insurance company, please allow [place] 1-2 business days to complete this process.  Drug prices often vary depending on where the prescription is filled and some pharmacies may offer cheaper prices.  The website www.goodrx.com contains coupons for medications through different pharmacies. The prices here do not account for what the cost may be with help from insurance (it may be cheaper with your insurance), but the website can give you the price if you did not use any insurance.  - You can print the associated coupon and take it with your prescription to the pharmacy.  - You may also stop by our office during regular business hours and pick up a GoodRx coupon card.  - If you need your prescription sent electronically to a different pharmacy, notify our office through [facility] or by phone at [phone] option 4.     Si Usted Necesita Algo Despus de Su Visita  Tambin puede enviarnos un mensaje a travs de Pharmacist, community. Por lo general respondemos a los mensajes de MyChart en el transcurso de 1 a 2 das hbiles.  Para renovar recetas, por favor pida  a su farmacia que se ponga en contacto con nuestra oficina. Harland Dingwall de fax es [place] [phone].  Si tiene un asunto urgente cuando la clnica est cerrada y que no puede esperar hasta el siguiente da hbil, puede llamar/localizar a su doctor(a) al nmero que aparece a continuacin.   Por favor, tenga en cuenta que aunque hacemos todo lo posible para estar disponibles para asuntos urgentes fuera del horario de [place], no estamos disponibles las 24 horas del da, los 7 das de la [place].   Si tiene un problema urgente y no puede comunicarse con nosotros, puede optar por buscar atencin mdica  en el consultorio de su doctor(a), en una clnica privada, en un  centro de atencin urgente o en una sala de emergencias.  Si tiene Engineering geologist, por favor llame inmediatamente al 911 o vaya a la sala de emergencias.  Nmeros de bper  - Dr. Nehemiah Massed: [phone]  - Dra. Moye: [phone]  - Dra. Nicole Kindred: [phone]  En caso de inclemencias del [place], por favor llame a Johnsie Kindred principal al [phone] para una actualizacin sobre el [place] de cualquier retraso o cierre.  Consejos para la medicacin en dermatologa: Por favor, guarde las cajas en las que vienen los medicamentos de uso tpico para ayudarle a seguir las instrucciones sobre dnde y cmo usarlos. Las farmacias generalmente imprimen las instrucciones del medicamento slo en las cajas y no directamente en los tubos del [place].   Si su medicamento es muy caro, por favor, pngase en contacto con Zigmund Daniel llamando al [phone] y presione la opcin 4 o envenos un mensaje a travs de Pharmacist, community.   No podemos decirle cul ser su copago por los medicamentos por adelantado ya que esto es diferente dependiendo de la cobertura de su seguro. Sin embargo, es posible que podamos encontrar un medicamento sustituto a Electrical engineer un formulario para que el seguro cubra el medicamento que se considera necesario.   Si se requiere una autorizacin previa para que su compaa de seguros [place] su medicamento, por favor permtanos de 1 a 2 das hbiles para completar este proceso.  Los precios de los medicamentos varan con frecuencia dependiendo del Environmental consultant de dnde se surte la receta y alguna farmacias pueden ofrecer precios ms baratos.  El sitio web www.goodrx.com tiene cupones para medicamentos de Airline pilot. Los precios aqu no tienen en cuenta lo que podra costar con la ayuda del seguro (puede ser ms barato con su seguro), pero el sitio web puede darle el precio si no utiliz Research scientist (physical sciences).  - Puede imprimir el cupn correspondiente y llevarlo con su receta  a la farmacia.  - Tambin puede pasar por nuestra oficina durante el horario de atencin regular y Charity fundraiser una tarjeta de cupones de GoodRx.  - Si necesita que su receta se enve electrnicamente a una farmacia diferente, informe a nuestra oficina a travs de MyChart de [place] o por telfono llamando al [phone] y presione la opcin 4.  Recommend taking Heliocare sun protection supplement daily in sunny weather for additional sun protection. For maximum protection on the sunniest days, you can take up to 2 capsules of regular Heliocare OR take 1 capsule of Heliocare Ultra. For prolonged exposure (such as a full day in the sun), you can repeat your dose of the supplement 4 hours after your first dose. Heliocare can be purchased at [employer], at some [facility] or at [email or web address].

## 2022-05-20 NOTE — Progress Notes (Signed)
New Patient Visit  Subjective  Samuel Mitchell is a 61 y.o. male who presents for the following: Actinic Keratosis (New patient here today for AK's at scalp, referred by PCP. ). The patient has spots, moles and lesions to be evaluated, some may be new or changing and the patient has concerns that these could be cancer.  The following portions of the chart were reviewed this encounter and updated as appropriate:   Tobacco  Allergies  Meds  Problems  Med Hx  Surg Hx  Fam Hx     Review of Systems:  No other skin or systemic complaints except as noted in HPI or Assessment and Plan.  Objective  Well appearing patient in no apparent distress; mood and affect are within normal limits.  A focused examination was performed including scalp, face. Relevant physical exam findings are noted in the Assessment and Plan.  Scalp (17) Erythematous thin papules/macules with gritty scale.   Scalp (5) Erythematous stuck-on, waxy papule or plaque   Assessment & Plan  AK (actinic keratosis) (17) Scalp Start 5-fluorouracil/calcipotriene cream twice a day for 7 days to affected areas including forehead, scalp. Prescription sent to [facility]. Patient provided with contact information for pharmacy and advised the pharmacy will mail the prescription to their home. Patient provided with handout reviewing treatment course and side effects and advised to call or message [place] on MyChart with any concerns.  Destruction of lesion - Scalp Complexity: simple   Destruction method: cryotherapy   Informed consent: discussed and consent obtained   Timeout:  patient name, date of birth, surgical site, and procedure verified Lesion destroyed using liquid nitrogen: Yes   Region frozen until ice ball extended beyond lesion: Yes   Outcome: patient tolerated procedure well with no complications   Post-procedure details: wound care instructions given    fluorouracil (EFUDEX) 5 % cream - Scalp Apply  topically 2 (two) times daily.  Inflamed seborrheic keratosis (5) Scalp Symptomatic, irritating, patient would like treated. Destruction of lesion - Scalp Complexity: simple   Destruction method: cryotherapy   Informed consent: discussed and consent obtained   Timeout:  patient name, date of birth, surgical site, and procedure verified Lesion destroyed using liquid nitrogen: Yes   Region frozen until ice ball extended beyond lesion: Yes   Outcome: patient tolerated procedure well with no complications   Post-procedure details: wound care instructions given    Actinic Damage - Severe, confluent actinic changes with pre-cancerous actinic keratoses  - Severe, chronic, not at goal, secondary to cumulative UV radiation exposure over time - diffuse scaly erythematous macules and papules with underlying dyspigmentation - Discussed Prescription "Field Treatment" for Severe, Chronic Confluent Actinic Changes with Pre-Cancerous Actinic Keratoses Field treatment involves treatment of an entire area of skin that has confluent Actinic Changes (Sun/ Ultraviolet light damage) and PreCancerous Actinic Keratoses by method of PhotoDynamic Therapy (PDT) and/or prescription Topical Chemotherapy agents such as 5-fluorouracil, 5-fluorouracil/calcipotriene, and/or imiquimod.  The purpose is to decrease the number of clinically evident and subclinical PreCancerous lesions to prevent progression to development of skin cancer by chemically destroying early precancer changes that may or may not be visible.  It has been shown to reduce the risk of developing skin cancer in the treated area. As a result of treatment, redness, scaling, crusting, and open sores may occur during treatment course. One or more than one of these methods may be used and may have to be used several times to control, suppress and eliminate the  PreCancerous changes. Discussed treatment course, expected reaction, and possible side effects. - Recommend  daily broad spectrum sunscreen SPF 30+ to sun-exposed areas, reapply every 2 hours as needed.  - Staying in the shade or wearing long sleeves, sun glasses (UVA+UVB protection) and wide brim hats (4-inch brim around the entire circumference of the hat) are also recommended. - Call for new or changing lesions.  Seborrheic Keratoses - Stuck-on, waxy, tan-brown papules and/or plaques  - Benign-appearing - Discussed benign etiology and prognosis. - Observe - Call for any changes  Return in about 3 months (around 08/20/2022) for AK follow up.  Graciella Belton, RMA, am acting as scribe for Sarina Ser, MD . Documentation: I have reviewed the above documentation for accuracy and completeness, and I agree with the above.  Sarina Ser, MD

## 2022-05-24 ENCOUNTER — Encounter: Payer: Self-pay | Admitting: Dermatology

## 2022-08-23 ENCOUNTER — Ambulatory Visit: Payer: Federal, State, Local not specified - PPO | Admitting: Dermatology

## 2022-08-23 ENCOUNTER — Encounter: Payer: Self-pay | Admitting: Dermatology

## 2022-08-23 DIAGNOSIS — Z79899 Other long term (current) drug therapy: Secondary | ICD-10-CM

## 2022-08-23 DIAGNOSIS — L821 Other seborrheic keratosis: Secondary | ICD-10-CM

## 2022-08-23 DIAGNOSIS — L570 Actinic keratosis: Secondary | ICD-10-CM | POA: Diagnosis not present

## 2022-08-23 DIAGNOSIS — L578 Other skin changes due to chronic exposure to nonionizing radiation: Secondary | ICD-10-CM

## 2022-08-23 DIAGNOSIS — Z5111 Encounter for antineoplastic chemotherapy: Secondary | ICD-10-CM

## 2022-08-23 DIAGNOSIS — L814 Other melanin hyperpigmentation: Secondary | ICD-10-CM | POA: Diagnosis not present

## 2022-08-23 NOTE — Progress Notes (Signed)
Follow-Up Visit   Subjective  Samuel Mitchell is a 61 y.o. male who presents for the following: Actinic Keratosis (3 month recheck. S/P 5FU, Calcipotriene treatment. Pink, mild scale). The patient has spots, moles and lesions to be evaluated, some may be new or changing and the patient has concerns that these could be cancer.  The following portions of the chart were reviewed this encounter and updated as appropriate:  Tobacco  Allergies  Meds  Problems  Med Hx  Surg Hx  Fam Hx     Review of Systems: No other skin or systemic complaints except as noted in HPI or Assessment and Plan.  Objective  Well appearing patient in no apparent distress; mood and affect are within normal limits.  A focused examination was performed including head, including the scalp, face, neck, nose, ears, eyelids, and lips. Relevant physical exam findings are noted in the Assessment and Plan.  Scalp, ears forehead x11 (11) Erythematous thin papules/macules with gritty scale.    Assessment & Plan   Actinic Damage - Severe, confluent actinic changes with pre-cancerous actinic keratoses  - Severe, chronic, not at goal, secondary to cumulative UV radiation exposure over time - diffuse scaly erythematous macules and papules with underlying dyspigmentation - Discussed Prescription "Field Treatment" for Severe, Chronic Confluent Actinic Changes with Pre-Cancerous Actinic Keratoses Field treatment involves treatment of an entire area of skin that has confluent Actinic Changes (Sun/ Ultraviolet light damage) and PreCancerous Actinic Keratoses by method of PhotoDynamic Therapy (PDT) and/or prescription Topical Chemotherapy agents such as 5-fluorouracil, 5-fluorouracil/calcipotriene, and/or imiquimod.  The purpose is to decrease the number of clinically evident and subclinical PreCancerous lesions to prevent progression to development of skin cancer by chemically destroying early precancer changes that may or may  not be visible.  It has been shown to reduce the risk of developing skin cancer in the treated area. As a result of treatment, redness, scaling, crusting, and open sores may occur during treatment course. One or more than one of these methods may be used and may have to be used several times to control, suppress and eliminate the PreCancerous changes. Discussed treatment course, expected reaction, and possible side effects. - Recommend daily broad spectrum sunscreen SPF 30+ to sun-exposed areas, reapply every 2 hours as needed.  - Staying in the shade or wearing long sleeves, sun glasses (UVA+UVB protection) and wide brim hats (4-inch brim around the entire circumference of the hat) are also recommended. - Call for new or changing lesions.  AK (actinic keratosis) (11) Scalp, ears forehead x11  Actinic keratoses are precancerous spots that appear secondary to cumulative UV radiation exposure/sun exposure over time. They are chronic with expected duration over 1 year. A portion of actinic keratoses will progress to squamous cell carcinoma of the skin. It is not possible to reliably predict which spots will progress to skin cancer and so treatment is recommended to prevent development of skin cancer.  Recommend daily broad spectrum sunscreen SPF 30+ to sun-exposed areas, reapply every 2 hours as needed.  Recommend staying in the shade or wearing long sleeves, sun glasses (UVA+UVB protection) and wide brim hats (4-inch brim around the entire circumference of the hat). Call for new or changing lesions.  Destruction of lesion - Scalp, ears forehead x11 Complexity: simple   Destruction method: cryotherapy   Informed consent: discussed and consent obtained   Timeout:  patient name, date of birth, surgical site, and procedure verified Lesion destroyed using liquid nitrogen: Yes  Region frozen until ice ball extended beyond lesion: Yes   Outcome: patient tolerated procedure well with no complications    Post-procedure details: wound care instructions given   Additional details:  Prior to procedure, discussed risks of blister formation, small wound, skin dyspigmentation, or rare scar following cryotherapy. Recommend Vaseline ointment to treated areas while healing.   Related Medications fluorouracil (EFUDEX) 5 % cream Apply topically 2 (two) times daily.  - Start 5-fluorouracil/calcipotriene cream twice a day for 7 days to affected areas including forehead and scalp. Prescription sent to [facility]. Patient advised they will receive an email to purchase the medication online and have it sent to their home. Patient provided with handout reviewing treatment course and side effects and advised to call or message [place] on MyChart with any concerns.  Begin October 1st.   Lentigines - Scattered tan macules - Due to sun exposure - Benign-appering, observe - Recommend daily broad spectrum sunscreen SPF 30+ to sun-exposed areas, reapply every 2 hours as needed. - Call for any changes  Seborrheic Keratoses - Stuck-on, waxy, tan-brown papules and/or plaques  - Benign-appearing - Discussed benign etiology and prognosis. - Observe - Call for any changes  Return in about 6 months (around 02/23/2023) for AK Follow Up.  I, Emelia Salisbury, CMA, am acting as scribe for Sarina Ser, MD. Documentation: I have reviewed the above documentation for accuracy and completeness, and I agree with the above.  Sarina Ser, MD

## 2022-08-23 NOTE — Patient Instructions (Addendum)
Cryotherapy Aftercare  Wash gently with soap and water everyday.   Apply Vaseline daily until healed.    - Start 5-fluorouracil/calcipotriene cream twice a day for 7 days to affected areas including forehead and scalp. Prescription sent to [facility]. Patient advised they will receive an email to purchase the medication online and have it sent to their home. Patient provided with handout reviewing treatment course and side effects and advised to call or message [place] on MyChart with any concerns. Begin October 1st.   Instructions for [facility] Medications  One or more of your medications was sent to the [facility]. You will receive an email from them and can purchase the medicine through that link. It will then be mailed to your home at the address you confirmed. If for any reason you do not receive an email from them, please check your spam folder. If you still do not find the email, please let us know. [facility] phone number is [phone].    Recommend daily broad spectrum sunscreen SPF 30+ to sun-exposed areas, reapply every 2 hours as needed. Call for new or changing lesions.  Staying in the shade or wearing long sleeves, sun glasses (UVA+UVB protection) and wide brim hats (4-inch brim around the entire circumference of the hat) are also recommended for sun protection.  Due to recent changes in healthcare laws, you may see results of your pathology and/or laboratory studies on MyChart before the doctors have had a chance to review them. We understand that in some cases there may be results that are confusing or concerning to you. Please understand that not all results are received at the same time and often the doctors may need to interpret multiple results in order to provide you with the best plan of care or course of treatment. Therefore, we ask that you please give [place] 2 business days to thoroughly review all your  results before contacting the office for clarification. Should we see a critical lab result, you will be contacted sooner.   If You Need Anything After Your Visit  If you have any questions or concerns for your doctor, please call our main line at [phone] and press option 4 to reach your doctor's medical assistant. If no one answers, please leave a voicemail as directed and we will return your call as soon as possible. Messages left after 4 pm will be answered the following business day.   You may also send [place] a message via [place]. We typically respond to MyChart messages within 1-2 business days.  For prescription refills, please ask your pharmacy to contact our office. Our fax number is [phone].  If you have an urgent issue when the clinic is closed that cannot wait until the next business day, you can page your doctor at the number below.    Please note that while we do our best to be available for urgent issues outside of office hours, we are not available 24/7.   If you have an urgent issue and are unable to reach [place], you may choose to seek medical care at your doctor's office, retail clinic, urgent care center, or emergency room.  If you have a medical emergency, please immediately call 911 or go to the emergency department.  Pager Numbers  - Dr. Nehemiah Massed: [phone]  - Dr. Laurence Ferrari: [phone]  - Dr. Nicole Kindred: [phone]  In the event of inclement weather, please call our main line at [phone] for an update on  the status of any delays or closures.  Dermatology Medication Tips: Please keep the boxes that topical medications come in in order to help keep track of the instructions about where and how to use these. Pharmacies typically print the medication instructions only on the boxes and not directly on the medication tubes.   If your medication is too expensive, please contact our office at [phone] option 4 or send [place] a message through [place].   We are  unable to tell what your co-pay for medications will be in advance as this is different depending on your insurance coverage. However, we may be able to find a substitute medication at lower cost or fill out paperwork to get insurance to cover a needed medication.   If a prior authorization is required to get your medication covered by your insurance company, please allow [place] 1-2 business days to complete this process.  Drug prices often vary depending on where the prescription is filled and some pharmacies may offer cheaper prices.  The website www.goodrx.com contains coupons for medications through different pharmacies. The prices here do not account for what the cost may be with help from insurance (it may be cheaper with your insurance), but the website can give you the price if you did not use any insurance.  - You can print the associated coupon and take it with your prescription to the pharmacy.  - You may also stop by our office during regular business hours and pick up a GoodRx coupon card.  - If you need your prescription sent electronically to a different pharmacy, notify our office through [facility] or by phone at [phone] option 4.     Si Usted Necesita Algo Despus de Su Visita  Tambin puede enviarnos un mensaje a travs de Pharmacist, community. Por lo general respondemos a los mensajes de MyChart en el transcurso de 1 a 2 das hbiles.  Para renovar recetas, por favor pida a su farmacia que se ponga en contacto con nuestra oficina. Harland Dingwall de fax es [place] [phone].  Si tiene un asunto urgente cuando la clnica est cerrada y que no puede esperar hasta el siguiente da hbil, puede llamar/localizar a su doctor(a) al nmero que aparece a continuacin.   Por favor, tenga en cuenta que aunque hacemos todo lo posible para estar disponibles para asuntos urgentes fuera del horario de [place], no estamos disponibles las 24 horas del da, los 7 das de la [place].   Si tiene un  problema urgente y no puede comunicarse con nosotros, puede optar por buscar atencin mdica  en el consultorio de su doctor(a), en una clnica privada, en un centro de atencin urgente o en una sala de emergencias.  Si tiene Engineering geologist, por favor llame inmediatamente al 911 o vaya a la sala de emergencias.  Nmeros de bper  - Dr. Nehemiah Massed: [phone]  - Dra. Moye: [phone]  - Dra. Nicole Kindred: [phone]  En caso de inclemencias del [place], por favor llame a Johnsie Kindred principal al [phone] para una actualizacin sobre el [place] de cualquier retraso o cierre.  Consejos para la medicacin en dermatologa: Por favor, guarde las cajas en las que vienen los medicamentos de uso tpico para ayudarle a seguir las instrucciones sobre dnde y cmo usarlos. Las farmacias generalmente imprimen las instrucciones del medicamento slo en las cajas y no directamente en los tubos del [place].   Si su medicamento es muy caro, por favor, pngase en contacto con nuestra oficina llamando al [phone]  y presione la opcin 4 o envenos un mensaje a travs de Pharmacist, community.   No podemos decirle cul ser su copago por los medicamentos por adelantado ya que esto es diferente dependiendo de la cobertura de su seguro. Sin embargo, es posible que podamos encontrar un medicamento sustituto a Electrical engineer un formulario para que el seguro cubra el medicamento que se considera necesario.   Si se requiere una autorizacin previa para que su compaa de seguros [place] su medicamento, por favor permtanos de 1 a 2 das hbiles para completar este proceso.  Los precios de los medicamentos varan con frecuencia dependiendo del Environmental consultant de dnde se surte la receta y alguna farmacias pueden ofrecer precios ms baratos.  El sitio web www.goodrx.com tiene cupones para medicamentos de Airline pilot. Los precios aqu no tienen en cuenta lo que podra costar con la ayuda del seguro (puede ser ms  barato con su seguro), pero el sitio web puede darle el precio si no utiliz Research scientist (physical sciences).  - Puede imprimir el cupn correspondiente y llevarlo con su receta a la farmacia.  - Tambin puede pasar por nuestra oficina durante el horario de atencin regular y Charity fundraiser una tarjeta de cupones de GoodRx.  - Si necesita que su receta se enve electrnicamente a una farmacia diferente, informe a nuestra oficina a travs de MyChart de [place] o por telfono llamando al [phone] y presione la opcin 4.

## 2022-11-16 ENCOUNTER — Telehealth: Payer: Self-pay | Admitting: Family Medicine

## 2022-11-16 DIAGNOSIS — I10 Essential (primary) hypertension: Secondary | ICD-10-CM

## 2022-11-16 DIAGNOSIS — E7800 Pure hypercholesterolemia, unspecified: Secondary | ICD-10-CM

## 2022-11-16 NOTE — Telephone Encounter (Signed)
Requested medication (s) are due for refill today: yes  Requested medication (s) are on the active medication list: yes  Last refill:  05/13/22  Future visit scheduled: no  Notes to clinic:  Unable to refill per protocol, courtesy refill already given, routing for provider approval.      Requested Prescriptions  Pending Prescriptions Disp Refills   amLODipine (NORVASC) 10 MG tablet 90 tablet 1     Cardiovascular: Calcium Channel Blockers 2 Failed - 11/16/2022  4:10 PM      Failed - Valid encounter within last 6 months    Recent Outpatient Visits           1 year ago Annual physical exam   [facility] Jerrol Banana., MD   1 year ago Other chest pain   [facility] Jerrol Banana., MD   1 year ago Essential (primary) hypertension   [facility] Jerrol Banana., MD   3 years ago Essential (primary) hypertension   [place], [place], [place]   4 years ago Dry cough   [place], [place]       Future Appointments             In 3 months Ralene Bathe, MD [place] BP in normal range    BP Readings from Last 1 Encounters:  11/11/21 133/88         Passed - Last Heart Rate in normal range    Pulse Readings from Last 1 Encounters:  11/11/21 87          atorvastatin (LIPITOR) 10 MG tablet 90 tablet 1    Sig: Take by mouth daily. Please schedule an office visit before anymore refills.     Cardiovascular:  Antilipid - Statins Failed - 11/16/2022  4:10 PM      Failed - Valid encounter within last 12 months    Recent Outpatient Visits           1 year ago Annual physical exam   [facility] Jerrol Banana., MD   1 year ago Other chest pain   [facility] Jerrol Banana., MD   1 year ago Essential (primary) hypertension   [facility] Jerrol Banana., MD   3 years ago Essential (primary) hypertension   [place], [place], [place]   4 years ago Dry cough   [place], [place]       Future Appointments             In 3 months Ralene Bathe, MD [place]            Failed - Lipid Panel in normal range within the last 12 months    Cholesterol, Total  Date Value Ref Range Status  11/17/2021 142 100 - 199 mg/dL Final   LDL Chol Calc (NIH)  Date Value Ref Range Status  11/17/2021 88 0 - 99 mg/dL Final   HDL  Date Value Ref Range Status  11/17/2021 41 >39 mg/dL Final   Triglycerides  Date Value Ref Range Status  11/17/2021 64 0 - 149 mg/dL Final         Passed - Patient is not pregnant

## 2022-11-16 NOTE — Telephone Encounter (Signed)
Medication Refill - Medication: atorvastatin (LIPITOR) 10 MG tablet [[id number]]  amLODipine (NORVASC) 10 MG tablet [[id number]]  Pt needs refill by tomorrow before the holiday / please advise / pt has 2 pills left    Has the patient contacted their pharmacy? Yes.   (Agent: If no, request that the patient contact the pharmacy for the refill. If patient does not wish to contact the pharmacy document the reason why and proceed with request.) (Agent: If yes, when and what did the pharmacy advise?)request sent to office with no response   Preferred Pharmacy (with phone number or street name): [id number] [place] [address] Has the patient been seen for an appointment in the last year OR does the patient have an upcoming appointment? Yes.    Agent: Please be advised that RX refills may take up to 3 business days. We ask that you follow-up with your pharmacy.

## 2022-11-17 ENCOUNTER — Other Ambulatory Visit: Payer: Self-pay

## 2022-11-17 DIAGNOSIS — E7800 Pure hypercholesterolemia, unspecified: Secondary | ICD-10-CM

## 2022-11-17 DIAGNOSIS — I10 Essential (primary) hypertension: Secondary | ICD-10-CM

## 2022-11-17 MED ORDER — ATORVASTATIN CALCIUM 10 MG PO TABS: 10.0000 mg | ORAL_TABLET | Freq: Every day | ORAL | 0 refills | Status: DC

## 2022-11-17 MED ORDER — AMLODIPINE BESYLATE 10 MG PO TABS: ORAL_TABLET | ORAL | 0 refills | Status: DC

## 2022-11-17 NOTE — Telephone Encounter (Signed)
Paitent now has appt to get refill done now on atorvastatin (LIPITOR) 10 MG tablet and amLODipine (NORVASC) 10 MG tablet. [id number] [place] [address]

## 2022-11-24 NOTE — Progress Notes (Deleted)
    MyChart Video Visit    Virtual Visit via Video Note   This format is felt to be most appropriate for this patient at this time. Physical exam was limited by quality of the video and audio technology used for the visit.   Patient location: *** Provider location: ***  I discussed the limitations of evaluation and management by telemedicine and the availability of in person appointments. The patient expressed understanding and agreed to proceed.  Patient: Samuel Mitchell   DOB: [birth year]   61 y.o. Male  MRN: [id number] Visit Date: 11/26/2022  Today's healthcare provider: Gwyneth Sprout, FNP   No chief complaint on file.  Subjective    HPI  ***   Medications: Outpatient Medications Prior to Visit  Medication Sig   amLODipine (NORVASC) 10 MG tablet Take 1 tablet by mouth every day   atorvastatin (LIPITOR) 10 MG tablet Take 1 tablet (10 mg total) by mouth daily. Please schedule an office visit before anymore refills.   celecoxib (CELEBREX) 100 MG capsule Take 1 capsule (100 mg total) by mouth 2 (two) times daily. (Patient not taking: Reported on 05/11/2021)   fluorouracil (EFUDEX) 5 % cream Apply topically 2 (two) times daily.   predniSONE (DELTASONE) 20 MG tablet Take 1 tablet (20 mg total) by mouth daily with breakfast. (Patient not taking: Reported on 11/11/2021)   No facility-administered medications prior to visit.    Review of Systems  {Labs  Heme  Chem  Endocrine  Serology  Results Review (optional):23779}   Objective    There were no vitals taken for this visit.  {Show previous vital signs (optional):23777}   Physical Exam     Assessment & Plan     ***  No follow-ups on file.     I discussed the assessment and treatment plan with the patient. The patient was provided an opportunity to ask questions and all were answered. The patient agreed with the plan and demonstrated an understanding of the instructions.   The patient was advised to call  back or seek an in-person evaluation if the symptoms worsen or if the condition fails to improve as anticipated.  I provided *** minutes of non-face-to-face time during this encounter.  {provider attestation***:1}  Gwyneth Sprout, [place] [phone] (phone) [phone] (fax)  [place]

## 2022-11-26 ENCOUNTER — Telehealth: Payer: Federal, State, Local not specified - PPO | Admitting: Family Medicine

## 2022-12-02 ENCOUNTER — Encounter: Payer: Self-pay | Admitting: Family Medicine

## 2022-12-02 ENCOUNTER — Telehealth: Payer: Federal, State, Local not specified - PPO | Admitting: Family Medicine

## 2022-12-02 VITALS — BP 137/89 | HR 93 | Resp 16 | Ht 70.0 in | Wt 221.0 lb

## 2022-12-02 DIAGNOSIS — I10 Essential (primary) hypertension: Secondary | ICD-10-CM | POA: Diagnosis not present

## 2022-12-02 DIAGNOSIS — E7800 Pure hypercholesterolemia, unspecified: Secondary | ICD-10-CM

## 2022-12-02 DIAGNOSIS — R7303 Prediabetes: Secondary | ICD-10-CM

## 2022-12-02 NOTE — Progress Notes (Signed)
MyChart Video Visit    Virtual Visit via Video Note   This format is felt to be most appropriate for this patient at this time. Physical exam was limited by quality of the video and audio technology used for the visit.   Patient location: outdoor/garage  Provider location:  [facility] [address]  [address] [place], [place] [address]   I discussed the limitations of evaluation and management by telemedicine and the availability of in person appointments. The patient expressed understanding and agreed to proceed.  Patient: Samuel Mitchell   DOB: 04-Jan-1961   61 y.o. Male  MRN: [id number] Visit Date: 12/02/2022  Today's healthcare provider: Gwyneth Sprout, FNP  Re Introduced to nurse practitioner role and practice setting.  All questions answered.  Discussed provider/patient relationship and expectations.  I,Samuel Mitchell,acting as a scribe for Gwyneth Sprout, FNP.,have documented all relevant documentation on the behalf of Gwyneth Sprout, FNP,as directed by  Gwyneth Sprout, FNP while in the presence of Gwyneth Sprout, FNP.   Chief Complaint  Patient presents with   Hypertension   Hyperlipidemia   Subjective    HPI  Hypertension, follow-up  BP Readings from Last 3 Encounters:  11/11/21 133/88  09/08/21 128/85  09/04/21 (!) 132/97   Wt Readings from Last 3 Encounters:  12/02/22 221 lb (100.2 kg)  11/11/21 232 lb 9.6 oz (105.5 kg)  09/08/21 231 lb (104.8 kg)     He was last seen for hypertension 1 years ago.  BP at that visit was 133/88. Management since that visit includes continue medications.  He reports excellent compliance with treatment. He is not having side effects.  He is following a Regular diet. He is exercising. He does not smoke.  Use of agents associated with hypertension: none.   Outside blood pressures are not checked. Symptoms: No chest pain No chest pressure  No palpitations No syncope  No dyspnea No orthopnea  No  paroxysmal nocturnal dyspnea No lower extremity edema   Pertinent labs Lab Results  Component Value Date   CHOL 142 11/17/2021   HDL 41 11/17/2021   LDLCALC 88 11/17/2021   TRIG 64 11/17/2021   CHOLHDL 3.5 11/17/2021   Lab Results  Component Value Date   NA 139 11/17/2021   K 4.1 11/17/2021   CREATININE 1.19 11/17/2021   EGFR 71 11/17/2021   GLUCOSE 111 (H) 11/17/2021   TSH 3.540 11/17/2021     The ASCVD Risk score (Arnett DK, et al., 2019) failed to calculate for the following reasons:   The systolic blood pressure is missing  ---------------------------------------------------------------------------------------------------  Lipid/Cholesterol, Follow-up  Last lipid panel Other pertinent labs  Lab Results  Component Value Date   CHOL 142 11/17/2021   HDL 41 11/17/2021   LDLCALC 88 11/17/2021   TRIG 64 11/17/2021   CHOLHDL 3.5 11/17/2021   Lab Results  Component Value Date   ALT 11 11/17/2021   AST 14 11/17/2021   PLT 247 11/17/2021   TSH 3.540 11/17/2021     He was last seen for this 1 years ago.  Management since that visit includes no changes.  He reports excellent compliance with treatment. He is not having side effects.   Symptoms: No chest pain No chest pressure/discomfort  No dyspnea No lower extremity edema  No numbness or tingling of extremity No orthopnea  No palpitations No paroxysmal nocturnal dyspnea  No speech difficulty No syncope   Current diet: in general,  an "unhealthy" diet Current exercise: walking  The ASCVD Risk score (Arnett DK, et al., 2019) failed to calculate for the following reasons:   The systolic blood pressure is missing  ---------------------------------------------------------------------------------------------------    Medications: Outpatient Medications Prior to Visit  Medication Sig   amLODipine (NORVASC) 10 MG tablet Take 1 tablet by mouth every day   atorvastatin (LIPITOR) 10 MG tablet Take 1 tablet (10 mg  total) by mouth daily. Please schedule an office visit before anymore refills.   [DISCONTINUED] celecoxib (CELEBREX) 100 MG capsule Take 1 capsule (100 mg total) by mouth 2 (two) times daily. (Patient not taking: Reported on 05/11/2021)   [DISCONTINUED] fluorouracil (EFUDEX) 5 % cream Apply topically 2 (two) times daily.   [DISCONTINUED] predniSONE (DELTASONE) 20 MG tablet Take 1 tablet (20 mg total) by mouth daily with breakfast. (Patient not taking: Reported on 11/11/2021)   No facility-administered medications prior to visit.    Review of Systems  Last CBC Lab Results  Component Value Date   WBC 6.7 11/17/2021   HGB 14.7 11/17/2021   HCT 44.6 11/17/2021   MCV 87 11/17/2021   MCH 28.5 11/17/2021   RDW 13.2 11/17/2021   PLT 247 03/70/4888   Last metabolic panel Lab Results  Component Value Date   GLUCOSE 111 (H) 11/17/2021   NA 139 11/17/2021   K 4.1 11/17/2021   CL 104 11/17/2021   CO2 24 11/17/2021   BUN 16 11/17/2021   CREATININE 1.19 11/17/2021   EGFR 71 11/17/2021   CALCIUM 9.2 11/17/2021   PROT 6.7 11/17/2021   ALBUMIN 4.1 11/17/2021   LABGLOB 2.6 11/17/2021   AGRATIO 1.6 11/17/2021   BILITOT 0.5 11/17/2021   ALKPHOS 99 11/17/2021   AST 14 11/17/2021   ALT 11 11/17/2021   ANIONGAP 8 09/04/2021   Last lipids Lab Results  Component Value Date   CHOL 142 11/17/2021   HDL 41 11/17/2021   LDLCALC 88 11/17/2021   TRIG 64 11/17/2021   CHOLHDL 3.5 11/17/2021   Last hemoglobin A1c Lab Results  Component Value Date   HGBA1C 6.1 (H) 11/17/2021   Last thyroid functions Lab Results  Component Value Date   TSH 3.540 11/17/2021    Objective    Ht _0  (1.778 m)   Wt 221 lb (100.2 kg)   BMI 31.71 kg/m   BP Readings from Last 3 Encounters:  11/11/21 133/88  09/08/21 128/85  09/04/21 (!) 132/97   Wt Readings from Last 3 Encounters:  12/02/22 221 lb (100.2 kg)  11/11/21 232 lb 9.6 oz (105.5 kg)  09/08/21 231 lb (104.8 kg)   SpO2 Readings from Last 3  Encounters:  11/11/21 100%  09/08/21 96%  09/04/21 97%   Physical Exam Vitals and nursing note reviewed.  Constitutional:      Appearance: Normal appearance. He is obese.  HENT:     Head: Normocephalic and atraumatic.  Pulmonary:     Effort: Pulmonary effort is normal.  Musculoskeletal:        General: Normal range of motion.     Cervical back: Normal range of motion.  Skin:    General: Skin is warm and dry.     Capillary Refill: Capillary refill takes less than 2 seconds.  Neurological:     General: No focal deficit present.     Mental Status: He is alert and oriented to person, place, and time. Mental status is at baseline.  Psychiatric:        Mood and Affect:  Mood normal.        Behavior: Behavior normal.        Thought Content: Thought content normal.        Judgment: Judgment normal.      Assessment & Plan     Problem List Items Addressed This Visit       Cardiovascular and Mediastinum   Essential (primary) hypertension - Primary    Chronic, unknown Pt will present for follow up BP check Denies current complaints or concerns Has been continuously taking Norvasc 10 mg Repeat CBC, CMP, LP      Relevant Orders   Comprehensive Metabolic Panel (CMET)   CBC   Lipid panel     Other   Hypercholesterolemia    Chronic, previously stable with LDL >70 Currently taking Lipitor 10 mg; will repeat LP I continue to recommend diet low in saturated fat and regular exercise - 30 min at least 5 times per week       Relevant Orders   Lipid panel   Prediabetes    Chronic, previously controlled with diet and exercise Repeat A1c Pt notes weight loss from increasing exercise/walking       Relevant Orders   Hemoglobin A1c   Return in about 1 year (around 12/03/2023) for annual examination.    I discussed the assessment and treatment plan with the patient. The patient was provided an opportunity to ask questions and all were answered. The patient agreed with the plan  and demonstrated an understanding of the instructions.   The patient was advised to call back or seek an in-person evaluation if the symptoms worsen or if the condition fails to improve as anticipated.  I provided 10 minutes of face-to-face time during this encounter discussing HTN, HLD and pre-diabetes. Pt will come in for BP check and labs.  Vonna Kotyk, FNP, have reviewed all documentation for this visit. The documentation on 12/02/22 for the exam, diagnosis, procedures, and orders are all accurate and complete.  Gwyneth Sprout, [place] [phone] (phone) [phone] (fax)  [place]

## 2022-12-02 NOTE — Assessment & Plan Note (Signed)
Chronic, unknown Pt will present for follow up BP check Denies current complaints or concerns Has been continuously taking Norvasc 10 mg Repeat CBC, CMP, LP

## 2022-12-02 NOTE — Assessment & Plan Note (Signed)
Chronic, previously stable with LDL >70 Currently taking Lipitor 10 mg; will repeat LP I continue to recommend diet low in saturated fat and regular exercise - 30 min at least 5 times per week

## 2022-12-02 NOTE — Assessment & Plan Note (Signed)
Chronic, previously controlled with diet and exercise Repeat A1c Pt notes weight loss from increasing exercise/walking

## 2022-12-03 ENCOUNTER — Other Ambulatory Visit: Payer: Self-pay | Admitting: Family Medicine

## 2022-12-03 DIAGNOSIS — I10 Essential (primary) hypertension: Secondary | ICD-10-CM

## 2022-12-03 DIAGNOSIS — E7800 Pure hypercholesterolemia, unspecified: Secondary | ICD-10-CM

## 2022-12-03 LAB — COMPREHENSIVE METABOLIC PANEL
ALT: 14 IU/L (ref 0–44)
AST: 20 IU/L (ref 0–40)
Albumin/Globulin Ratio: 1.7 (ref 1.2–2.2)
Albumin: 4.5 g/dL (ref 3.8–4.9)
Alkaline Phosphatase: 114 IU/L (ref 44–121)
BUN/Creatinine Ratio: 13 (ref 9–20)
BUN: 19 mg/dL (ref 6–24)
Bilirubin Total: 0.6 mg/dL (ref 0.0–1.2)
CO2: 23 mmol/L (ref 20–29)
Calcium: 9.3 mg/dL (ref 8.7–10.2)
Chloride: 103 mmol/L (ref 96–106)
Creatinine, Ser: 1.41 mg/dL — ABNORMAL HIGH (ref 0.76–1.27)
Globulin, Total: 2.6 g/dL (ref 1.5–4.5)
Glucose: 95 mg/dL (ref 70–99)
Potassium: 4.1 mmol/L (ref 3.5–5.2)
Sodium: 140 mmol/L (ref 134–144)
Total Protein: 7.1 g/dL (ref 6.0–8.5)
eGFR: 57 mL/min/{1.73_m2} — ABNORMAL LOW (ref >59)

## 2022-12-03 LAB — LIPID PANEL
Chol/HDL Ratio: 3.4 ratio (ref 0.0–5.0)
Cholesterol, Total: 129 mg/dL (ref 100–199)
HDL: 38 mg/dL — ABNORMAL LOW (ref >39)
LDL Chol Calc (NIH): 76 mg/dL (ref 0–99)
Triglycerides: 76 mg/dL (ref 0–149)
VLDL Cholesterol Cal: 15 mg/dL (ref 5–40)

## 2022-12-03 LAB — CBC
Hematocrit: 39.4 % (ref 37.5–51.0)
Hemoglobin: 12.7 g/dL — ABNORMAL LOW (ref 13.0–17.7)
MCH: 26.5 pg — ABNORMAL LOW (ref 26.6–33.0)
MCHC: 32.2 g/dL (ref 31.5–35.7)
MCV: 82 fL (ref 79–97)
Platelets: 283 10*3/uL (ref 150–450)
RBC: 4.79 x10E6/uL (ref 4.14–5.80)
RDW: 14.1 % (ref 11.6–15.4)
WBC: 7 10*3/uL (ref 3.4–10.8)

## 2022-12-03 LAB — HEMOGLOBIN A1C
Est. average glucose Bld gHb Est-mCnc: 131 mg/dL
Hgb A1c MFr Bld: 6.2 % — ABNORMAL HIGH (ref 4.8–5.6)

## 2022-12-03 MED ORDER — AMLODIPINE BESYLATE 10 MG PO TABS: ORAL_TABLET | ORAL | 3 refills | Status: DC

## 2022-12-03 MED ORDER — ATORVASTATIN CALCIUM 10 MG PO TABS: 10.0000 mg | ORAL_TABLET | Freq: Every day | ORAL | 3 refills | Status: DC

## 2022-12-03 NOTE — Progress Notes (Signed)
Labs continue to show pre-diabetes; however, stable without medication. Continue to recommend balanced, lower carb meals. Smaller meal size, adding snacks. Choosing water as drink of choice and increasing purposeful exercise. Cholesterol remains stable and is improved from last year on current regimen. Continue. Slight increase in creatinine noted; encourage 64 oz/water/day. Recommend repeat in 2-3 months. If elevation remains recommend consult with nephrology.

## 2023-03-02 ENCOUNTER — Ambulatory Visit: Payer: Federal, State, Local not specified - PPO | Admitting: Dermatology

## 2023-05-19 ENCOUNTER — Other Ambulatory Visit: Payer: Self-pay | Admitting: Family Medicine

## 2023-05-19 ENCOUNTER — Telehealth: Payer: Self-pay | Admitting: Family Medicine

## 2023-05-19 NOTE — Telephone Encounter (Signed)
Department of [facility] Release was received on 05/18/2023 and Medical Records was faxed on 05/19/2023 to Department of [facility].

## 2023-09-05 ENCOUNTER — Other Ambulatory Visit: Payer: Self-pay | Admitting: Family Medicine

## 2023-09-05 DIAGNOSIS — E7800 Pure hypercholesterolemia, unspecified: Secondary | ICD-10-CM

## 2023-09-05 DIAGNOSIS — I10 Essential (primary) hypertension: Secondary | ICD-10-CM

## 2023-09-06 NOTE — Telephone Encounter (Signed)
Courtesy refill, pt. Will call back to make appointment. Requested Prescriptions  Pending Prescriptions Disp Refills   amLODipine (NORVASC) 10 MG tablet [Pharmacy Med Name: AMLODIPINE BESYLATE 10 MG TAB] 30 tablet 0    Sig: TAKE 1 TABLET BY MOUTH EVERY DAY     Cardiovascular: Calcium Channel Blockers 2 Failed - 09/05/2023 10:59 AM      Failed - Valid encounter within last 6 months    Recent Outpatient Visits           9 months ago Essential (primary) hypertension   [place] [facility] Merita Norton T, FNP   1 year ago Annual physical exam   [place] [facility] Bosie Clos, [place]   1 year ago Other chest pain   [place] [facility] Bosie Clos, [place]   2 years ago Essential (primary) hypertension   [place] Bosie Clos, [place]   4 years ago Essential (primary) hypertension   [place] [facility] Joycelyn Man M, [place]              Passed - Last BP in normal range    BP Readings from Last 1 Encounters:  12/02/22 137/89         Passed - Last Heart Rate in normal range    Pulse Readings from Last 1 Encounters:  12/02/22 93          atorvastatin (LIPITOR) 10 MG tablet [Pharmacy Med Name: ATORVASTATIN 10 MG TABLET] 30 tablet 0    Sig: TAKE 1 TABLET BY MOUTH EVERY DAY     Cardiovascular:  Antilipid - Statins Failed - 09/05/2023 10:59 AM      Failed - Lipid Panel in normal range within the last 12 months    Cholesterol, Total  Date Value Ref Range Status  12/02/2022 129 100 - 199 mg/dL Final   LDL Chol Calc (NIH)  Date Value Ref Range Status  12/02/2022 76 0 - 99 mg/dL Final   HDL  Date Value Ref Range Status  12/02/2022 38 (L) >39 mg/dL Final   Triglycerides  Date Value Ref Range Status  12/02/2022 76 0 - 149 mg/dL Final         Passed - Patient is not pregnant      Passed - Valid encounter within last 12 months    Recent Outpatient Visits            9 months ago Essential (primary) hypertension   [place] [facility] Jacky Kindle, FNP   1 year ago Annual physical exam   [place] [facility] Bosie Clos, [place]   1 year ago Other chest pain   [place] [facility] Bosie Clos, MD   2 years ago Essential (primary) hypertension    [facility] Bosie Clos, [place]   4 years ago Essential (primary) hypertension   [facility] [place], [place], [place]

## 2023-12-07 NOTE — Patient Instructions (Signed)

## 2023-12-08 ENCOUNTER — Ambulatory Visit: Payer: Federal, State, Local not specified - PPO | Admitting: Family Medicine

## 2023-12-08 ENCOUNTER — Other Ambulatory Visit: Payer: Self-pay | Admitting: Family Medicine

## 2023-12-08 ENCOUNTER — Encounter: Payer: Self-pay | Admitting: Family Medicine

## 2023-12-08 VITALS — BP 138/84 | HR 79 | Ht 70.0 in | Wt 236.0 lb

## 2023-12-08 DIAGNOSIS — E7800 Pure hypercholesterolemia, unspecified: Secondary | ICD-10-CM

## 2023-12-08 DIAGNOSIS — H9313 Tinnitus, bilateral: Secondary | ICD-10-CM | POA: Insufficient documentation

## 2023-12-08 DIAGNOSIS — Z125 Encounter for screening for malignant neoplasm of prostate: Secondary | ICD-10-CM | POA: Insufficient documentation

## 2023-12-08 DIAGNOSIS — Z0000 Encounter for general adult medical examination without abnormal findings: Secondary | ICD-10-CM | POA: Insufficient documentation

## 2023-12-08 DIAGNOSIS — E6609 Other obesity due to excess calories: Secondary | ICD-10-CM | POA: Insufficient documentation

## 2023-12-08 DIAGNOSIS — E66811 Obesity, class 1: Secondary | ICD-10-CM

## 2023-12-08 DIAGNOSIS — R7303 Prediabetes: Secondary | ICD-10-CM | POA: Diagnosis not present

## 2023-12-08 DIAGNOSIS — Z6833 Body mass index (BMI) 33.0-33.9, adult: Secondary | ICD-10-CM

## 2023-12-08 DIAGNOSIS — I10 Essential (primary) hypertension: Secondary | ICD-10-CM | POA: Diagnosis not present

## 2023-12-08 DIAGNOSIS — R7989 Other specified abnormal findings of blood chemistry: Secondary | ICD-10-CM | POA: Insufficient documentation

## 2023-12-08 MED ORDER — ATORVASTATIN CALCIUM 10 MG PO TABS: 10.0000 mg | ORAL_TABLET | Freq: Every day | ORAL | 1 refills | Status: DC

## 2023-12-08 MED ORDER — AMLODIPINE BESYLATE 10 MG PO TABS: ORAL_TABLET | ORAL | 1 refills | Status: DC

## 2023-12-08 NOTE — Assessment & Plan Note (Signed)
Chronic, remains elevated BP goal remains 119/79 Last year creatinine was elevated Remains on norvasc 10 mg without complications

## 2023-12-08 NOTE — Assessment & Plan Note (Signed)
Body mass index is 33.86 kg/m. Discussed importance of healthy weight management Discussed diet and exercise 15# weight gain noted

## 2023-12-08 NOTE — Assessment & Plan Note (Signed)
Chronic, unknown Repeat A1c Continue to recommend balanced, lower carb meals. Smaller meal size, adding snacks. Choosing water as drink of choice and increasing purposeful exercise.  

## 2023-12-08 NOTE — Assessment & Plan Note (Signed)
Chronic, stable Followed by [facility] team No hearing aids noted

## 2023-12-08 NOTE — Assessment & Plan Note (Signed)
Chronic, on statin lipitor 10 mg The ASCVD Risk score (Arnett DK, et al., 2019) failed to calculate for the following reasons:   The valid total cholesterol range is 130 to 320 mg/dL I continue to recommend diet low in saturated fat and regular exercise - 30 min at least 5 times per week

## 2023-12-08 NOTE — Assessment & Plan Note (Signed)
Denies LUTS; recommend PSA in place of DRE. If PSA is elevated for age, we will repeat; if PSA remains elevated pt will be referred to urology for DRE and next steps for best treatment.  

## 2023-12-08 NOTE — Assessment & Plan Note (Signed)

## 2023-12-08 NOTE — Assessment & Plan Note (Signed)
Previously elevated 1 year ago Repeat CMP Continue to monitor HTN to further assist Consider additional of referral to nephrology

## 2023-12-08 NOTE — Progress Notes (Signed)
Complete physical exam   Patient: Samuel Mitchell   DOB: [birth year]   62 y.o. Male  MRN: [id number] Visit Date: 12/08/2023  Today's healthcare provider: Jacky Kindle, FNP  Re Introduced to nurse practitioner role and practice setting.  All questions answered.  Discussed provider/patient relationship and expectations.  Chief Complaint  Patient presents with   Follow-up   Subjective    Samuel Mitchell is a 62 y.o. male who presents today for a complete physical exam.  He reports consuming a general diet. The patient does not participate in regular exercise at present. He generally feels fairly well. He reports sleeping fairly well. He does have additional problems to discuss today.   HPI   Presents for med refills; reports increased stress as his mom is "losing her mind" and he knows he's put on weight since retirement (15#) and she is currently inpatient with dehydration and PNA at [facility].  Past Medical History:  Diagnosis Date   Hypertension    Kidney stones    Wears contact lenses    Wears dentures    full upper   Past Surgical History:  Procedure Laterality Date   COLONOSCOPY WITH PROPOFOL N/A 09/09/2016   Procedure: COLONOSCOPY WITH PROPOFOL;  Surgeon: Midge Minium, MD;  Location: [facility];  Service: Endoscopy;  Laterality: N/A;   FOOT SURGERY Left 2007   Social History   Socioeconomic History   Marital status: Married    Spouse name: Not on file   Number of children: Not on file   Years of education: Not on file   Highest education level: Not on file  Occupational History   Not on file  Tobacco Use   Smoking status: Never   Smokeless tobacco: Never  Substance and Sexual Activity   Alcohol use: No    Alcohol/week: 0.0 standard drinks of alcohol   Drug use: Not on file   Sexual activity: Not on file  Other Topics Concern   Not on file  Social History Narrative   Not on file   Social Drivers of Health   Financial Resource Strain: Patient  Declined (12/07/2023)   Overall Financial Resource Strain (CARDIA)    Difficulty of Paying Living Expenses: Patient declined  Food Insecurity: Patient Declined (12/07/2023)   Hunger Vital Sign    Worried About Running Out of Food in the Last Year: Patient declined    Ran Out of Food in the Last Year: Patient declined  Transportation Needs: Patient Declined (12/07/2023)   PRAPARE - Administrator, Civil Service (Medical): Patient declined    Lack of Transportation (Non-Medical): Patient declined  Physical Activity: Unknown (12/07/2023)   Exercise Vital Sign    Days of Exercise per Week: Patient declined    Minutes of Exercise per Session: Not on file  Stress: Patient Declined (12/07/2023)   [employer] of Occupational Health - Occupational Stress Questionnaire    Feeling of Stress : Patient declined  Social Connections: Unknown (12/07/2023)   Social Connection and Isolation Panel [NHANES]    Frequency of Communication with Friends and Family: Patient declined    Frequency of Social Gatherings with Friends and Family: Patient declined    Attends Religious Services: Patient declined    Database administrator or Organizations: Patient declined    Attends Banker Meetings: Not on file    Marital Status: Patient declined  Intimate Partner Violence: Not on file   Family Status  Relation  Name Status   Mother  Alive   Father  Deceased at age 61       MI   Brother  Alive   PGF  Deceased at age 70       unknown causes  No partnership data on file   Family History  Problem Relation Age of Onset   Alzheimer's disease Mother    Alcohol abuse Father    Hypertension Brother    No Known Allergies  Patient Care Team: Jacky Kindle, FNP as PCP - General (Family Medicine)   Medications: Outpatient Medications Prior to Visit  Medication Sig   [DISCONTINUED] amLODipine (NORVASC) 10 MG tablet TAKE 1 TABLET BY MOUTH EVERY DAY   [DISCONTINUED] atorvastatin  (LIPITOR) 10 MG tablet TAKE 1 TABLET BY MOUTH EVERY DAY   No facility-administered medications prior to visit.   Last CBC Lab Results  Component Value Date   WBC 7.0 12/02/2022   HGB 12.7 (L) 12/02/2022   HCT 39.4 12/02/2022   MCV 82 12/02/2022   MCH 26.5 (L) 12/02/2022   RDW 14.1 12/02/2022   PLT 283 12/02/2022   Last metabolic panel Lab Results  Component Value Date   GLUCOSE 95 12/02/2022   NA 140 12/02/2022   K 4.1 12/02/2022   CL 103 12/02/2022   CO2 23 12/02/2022   BUN 19 12/02/2022   CREATININE 1.41 (H) 12/02/2022   EGFR 57 (L) 12/02/2022   CALCIUM 9.3 12/02/2022   PROT 7.1 12/02/2022   ALBUMIN 4.5 12/02/2022   LABGLOB 2.6 12/02/2022   AGRATIO 1.7 12/02/2022   BILITOT 0.6 12/02/2022   ALKPHOS 114 12/02/2022   AST 20 12/02/2022   ALT 14 12/02/2022   ANIONGAP 8 09/04/2021   Last lipids Lab Results  Component Value Date   CHOL 129 12/02/2022   HDL 38 (L) 12/02/2022   LDLCALC 76 12/02/2022   TRIG 76 12/02/2022   CHOLHDL 3.4 12/02/2022   Last hemoglobin A1c Lab Results  Component Value Date   HGBA1C 6.2 (H) 12/02/2022   Last thyroid functions Lab Results  Component Value Date   TSH 3.540 11/17/2021    Objective    BP 138/84 (BP Location: Right Arm, Patient Position: Sitting, Cuff Size: Large)   Pulse 79   Ht 5\' 10"  (1.778 m)   Wt 236 lb (107 kg)   SpO2 98%   BMI 33.86 kg/m   BP Readings from Last 3 Encounters:  12/08/23 138/84  12/02/22 137/89  11/11/21 133/88   Wt Readings from Last 3 Encounters:  12/08/23 236 lb (107 kg)  12/02/22 221 lb (100.2 kg)  11/11/21 232 lb 9.6 oz (105.5 kg)   SpO2 Readings from Last 3 Encounters:  12/08/23 98%  12/02/22 97%  11/11/21 100%   Physical Exam Vitals and nursing note reviewed.  Constitutional:      General: He is awake. He is not in acute distress.    Appearance: Normal appearance. He is well-developed and well-groomed. He is obese. He is not ill-appearing, toxic-appearing or diaphoretic.   HENT:     Head: Normocephalic and atraumatic.     Jaw: There is normal jaw occlusion. No trismus, tenderness, swelling or pain on movement.     Salivary Glands: Right salivary gland is not diffusely enlarged or tender. Left salivary gland is not diffusely enlarged or tender.     Right Ear: Hearing, tympanic membrane, ear canal and external ear normal. There is no impacted cerumen.     Left Ear: Hearing, tympanic  membrane, ear canal and external ear normal. There is no impacted cerumen.     Ears:     Comments: Complaints of bilateral tinnitus     Nose: Nose normal. No congestion or rhinorrhea.     Right Turbinates: Not enlarged, swollen or pale.     Left Turbinates: Not enlarged, swollen or pale.     Right Sinus: No maxillary sinus tenderness or frontal sinus tenderness.     Left Sinus: No maxillary sinus tenderness or frontal sinus tenderness.     Mouth/Throat:     Lips: Pink.     Mouth: Mucous membranes are moist. No injury, lacerations, oral lesions or angioedema.     Pharynx: Oropharynx is clear. Uvula midline. No pharyngeal swelling, oropharyngeal exudate or posterior oropharyngeal erythema.     Tonsils: No tonsillar exudate or tonsillar abscesses.  Eyes:     General: Lids are normal. Vision grossly intact. Gaze aligned appropriately.        Right eye: No discharge.        Left eye: No discharge.     Extraocular Movements: Extraocular movements intact.     Conjunctiva/sclera: Conjunctivae normal.     Pupils: Pupils are equal, round, and reactive to light.  Neck:     Thyroid: No thyroid mass, thyromegaly or thyroid tenderness.     Vascular: No carotid bruit.     Trachea: Trachea normal. No tracheal tenderness.  Cardiovascular:     Rate and Rhythm: Normal rate and regular rhythm.     Pulses: Normal pulses.          Carotid pulses are 2+ on the right side and 2+ on the left side.      Radial pulses are 2+ on the right side and 2+ on the left side.       Femoral pulses are 2+ on  the right side and 2+ on the left side.      Popliteal pulses are 2+ on the right side and 2+ on the left side.       Dorsalis pedis pulses are 2+ on the right side and 2+ on the left side.       Posterior tibial pulses are 2+ on the right side and 2+ on the left side.     Heart sounds: Normal heart sounds, S1 normal and S2 normal. No murmur heard.    No friction rub. No gallop.  Pulmonary:     Effort: Pulmonary effort is normal. No respiratory distress.     Breath sounds: Normal breath sounds and air entry. No stridor. No wheezing, rhonchi or rales.  Chest:     Chest wall: No tenderness.  Abdominal:     General: Abdomen is flat. Bowel sounds are normal. There is no distension.     Palpations: Abdomen is soft. There is no mass.     Tenderness: There is no abdominal tenderness. There is no guarding or rebound.     Hernia: No hernia is present.  Genitourinary:    Comments: Exam deferred; denies complaints Musculoskeletal:        General: No swelling, tenderness, deformity or signs of injury. Normal range of motion.     Cervical back: Normal range of motion and neck supple. No rigidity or tenderness.     Right lower leg: No edema.     Left lower leg: No edema.  Lymphadenopathy:     Cervical: No cervical adenopathy.     Right cervical: No superficial, deep or posterior cervical adenopathy.  Left cervical: No superficial, deep or posterior cervical adenopathy.  Skin:    General: Skin is warm and dry.     Capillary Refill: Capillary refill takes less than 2 seconds.     Coloration: Skin is not jaundiced or pale.     Findings: No bruising, erythema, lesion or rash.  Neurological:     General: No focal deficit present.     Mental Status: He is alert and oriented to person, place, and time. Mental status is at baseline.     GCS: GCS eye subscore is 4. GCS verbal subscore is 5. GCS motor subscore is 6.     Sensory: Sensation is intact. No sensory deficit.     Motor: Motor function is  intact. No weakness.     Coordination: Coordination is intact.     Gait: Gait is intact.  Psychiatric:        Attention and Perception: Attention and perception normal.        Mood and Affect: Mood and affect normal.        Speech: Speech normal.        Behavior: Behavior normal. Behavior is cooperative.        Thought Content: Thought content normal.        Cognition and Memory: Cognition normal.        Judgment: Judgment normal.     Last depression screening scores    12/02/2022    1:45 PM 11/11/2021    2:03 PM  PHQ 2/9 Scores  PHQ - 2 Score 0 0  PHQ- 9 Score 0 0   Last fall risk screening    12/08/2023    8:42 AM  Fall Risk   Falls in the past year? 0  Number falls in past yr: 0  Injury with Fall? 0   Last Audit-C alcohol use screening    12/07/2023   10:19 PM  Alcohol Use Disorder Test (AUDIT)  1. How often do you have a drink containing alcohol? 0      Patient-reported   A score of 3 or more in women, and 4 or more in men indicates increased risk for alcohol abuse, EXCEPT if all of the points are from question 1   No results found for any visits on 12/08/23.  Assessment & Plan    Routine Health Maintenance and Physical Exam  Exercise Activities and Dietary recommendations  Goals   None     Immunization History  Administered Date(s) Administered   Td 02/28/2003   Tdap 06/13/2015    Health Maintenance  Topic Date Due   COVID-19 Vaccine (1) Never done   Zoster Vaccines- Shingrix (1 of 2) Never done   INFLUENZA VACCINE  03/26/2024 (Originally 07/28/2023)   DTaP/Tdap/Td (3 - Td or Tdap) 06/12/2025   Colonoscopy  09/09/2026   Hepatitis C Screening  Completed   HIV Screening  Completed   HPV VACCINES  Aged Out    Discussed health benefits of physical activity, and encouraged him to engage in regular exercise appropriate for his age and condition.  Problem List Items Addressed This Visit       Cardiovascular and Mediastinum   Essential (primary)  hypertension   Chronic, remains elevated BP goal remains 119/79 Last year creatinine was elevated Remains on norvasc 10 mg without complications       Relevant Medications   amLODipine (NORVASC) 10 MG tablet   atorvastatin (LIPITOR) 10 MG tablet   Other Relevant Orders   Comprehensive metabolic panel  TSH   Lipid panel     Other   Annual physical exam - Primary   Things to do to keep yourself healthy  - Exercise at least 30-45 minutes a day, 3-4 days a week.  - Eat a low-fat diet with lots of fruits and vegetables, up to 7-9 servings per day.  - Seatbelts can save your life. Wear them always.  - Smoke detectors on every level of your home, check batteries every year.  - Eye Doctor - have an eye exam every 1-2 years  - Safe sex - if you may be exposed to STDs, use a condom.  - Alcohol -  If you drink, do it moderately, less than 2 drinks per day.  - Health Care Power of Attorney. Choose someone to speak for you if you are not able.  - Depression is common in our stressful world.If you're feeling down or losing interest in things you normally enjoy, please come in for a visit.  - Violence - If anyone is threatening or hurting you, please call immediately.       Relevant Orders   Comprehensive metabolic panel   TSH   CBC with Differential/Platelet   Lipid panel   Vitamin D (25 hydroxy)   B12 and Folate Panel   Bilateral tinnitus   Chronic, stable Followed by [facility] team No hearing aids noted      Class 1 obesity due to excess calories with serious comorbidity and body mass index (BMI) of 33.0 to 33.9 in adult   Body mass index is 33.86 kg/m. Discussed importance of healthy weight management Discussed diet and exercise 15# weight gain noted      Elevated serum creatinine   Previously elevated 1 year ago Repeat CMP Continue to monitor HTN to further assist Consider additional of referral to nephrology       Encounter for screening prostate specific antigen (PSA)  measurement   Denies LUTS; recommend PSA in place of DRE. If PSA is elevated for age, we will repeat; if PSA remains elevated pt will be referred to urology for DRE and next steps for best treatment.       Relevant Orders   PSA Total (Reflex To Free)   Hypercholesterolemia   Chronic, on statin lipitor 10 mg The ASCVD Risk score (Arnett DK, et al., 2019) failed to calculate for the following reasons:   The valid total cholesterol range is 130 to 320 mg/dL I continue to recommend diet low in saturated fat and regular exercise - 30 min at least 5 times per week       Relevant Medications   amLODipine (NORVASC) 10 MG tablet   atorvastatin (LIPITOR) 10 MG tablet   Other Relevant Orders   Lipid panel   Prediabetes   Chronic, unknown Repeat A1c Continue to recommend balanced, lower carb meals. Smaller meal size, adding snacks. Choosing water as drink of choice and increasing purposeful exercise.       Relevant Orders   Hemoglobin A1c   Return in about 1 year (around 12/07/2024), or if symptoms worsen or fail to improve, for annual examination.    Leilani Merl, FNP, have reviewed all documentation for this visit. The documentation on 12/08/23 for the exam, diagnosis, procedures, and orders are all accurate and complete.  Jacky Kindle, FNP  [facility] [phone] (phone) [phone] (fax)  [facility]

## 2023-12-09 LAB — PSA TOTAL (REFLEX TO FREE): Prostate Specific Ag, Serum: 1.8 ng/mL (ref 0.0–4.0)

## 2023-12-09 LAB — CBC WITH DIFFERENTIAL/PLATELET
Basophils Absolute: 0.1 10*3/uL (ref 0.0–0.2)
Basos: 1 %
EOS (ABSOLUTE): 0.2 10*3/uL (ref 0.0–0.4)
Eos: 2 %
Hematocrit: 44.9 % (ref 37.5–51.0)
Hemoglobin: 14.9 g/dL (ref 13.0–17.7)
Immature Grans (Abs): 0 10*3/uL (ref 0.0–0.1)
Immature Granulocytes: 0 %
Lymphocytes Absolute: 1.7 10*3/uL (ref 0.7–3.1)
Lymphs: 18 %
MCH: 29.1 pg (ref 26.6–33.0)
MCHC: 33.2 g/dL (ref 31.5–35.7)
MCV: 88 fL (ref 79–97)
Monocytes Absolute: 1 10*3/uL — ABNORMAL HIGH (ref 0.1–0.9)
Monocytes: 11 %
Neutrophils Absolute: 6.4 10*3/uL (ref 1.4–7.0)
Neutrophils: 68 %
Platelets: 230 10*3/uL (ref 150–450)
RBC: 5.12 x10E6/uL (ref 4.14–5.80)
RDW: 13.5 % (ref 11.6–15.4)
WBC: 9.4 10*3/uL (ref 3.4–10.8)

## 2023-12-09 LAB — VITAMIN D 25 HYDROXY (VIT D DEFICIENCY, FRACTURES): Vit D, 25-Hydroxy: 18.2 ng/mL — ABNORMAL LOW (ref 30.0–100.0)

## 2023-12-09 LAB — LIPID PANEL
Chol/HDL Ratio: 4 {ratio} (ref 0.0–5.0)
Cholesterol, Total: 147 mg/dL (ref 100–199)
HDL: 37 mg/dL — ABNORMAL LOW (ref >39)
LDL Chol Calc (NIH): 96 mg/dL (ref 0–99)
Triglycerides: 71 mg/dL (ref 0–149)
VLDL Cholesterol Cal: 14 mg/dL (ref 5–40)

## 2023-12-09 LAB — B12 AND FOLATE PANEL
Folate: 3.6 ng/mL (ref >3.0)
Vitamin B-12: 268 pg/mL (ref 232–1245)

## 2023-12-09 LAB — COMPREHENSIVE METABOLIC PANEL
ALT: 11 [IU]/L (ref 0–44)
AST: 14 [IU]/L (ref 0–40)
Albumin: 4 g/dL (ref 3.8–4.9)
Alkaline Phosphatase: 122 [IU]/L — ABNORMAL HIGH (ref 44–121)
BUN/Creatinine Ratio: 13 (ref 10–24)
BUN: 15 mg/dL (ref 8–27)
Bilirubin Total: 0.4 mg/dL (ref 0.0–1.2)
CO2: 24 mmol/L (ref 20–29)
Calcium: 9.1 mg/dL (ref 8.6–10.2)
Chloride: 105 mmol/L (ref 96–106)
Creatinine, Ser: 1.19 mg/dL (ref 0.76–1.27)
Globulin, Total: 2.9 g/dL (ref 1.5–4.5)
Glucose: 99 mg/dL (ref 70–99)
Potassium: 4.2 mmol/L (ref 3.5–5.2)
Sodium: 141 mmol/L (ref 134–144)
Total Protein: 6.9 g/dL (ref 6.0–8.5)
eGFR: 70 mL/min/{1.73_m2} (ref >59)

## 2023-12-09 LAB — HEMOGLOBIN A1C
Est. average glucose Bld gHb Est-mCnc: 134 mg/dL
Hgb A1c MFr Bld: 6.3 % — ABNORMAL HIGH (ref 4.8–5.6)

## 2023-12-09 LAB — TSH: TSH: 2.97 u[IU]/mL (ref 0.450–4.500)

## 2024-04-03 ENCOUNTER — Encounter: Payer: Self-pay | Admitting: Family Medicine

## 2024-04-03 ENCOUNTER — Ambulatory Visit (INDEPENDENT_AMBULATORY_CARE_PROVIDER_SITE_OTHER): Payer: Self-pay | Admitting: Family Medicine

## 2024-04-03 VITALS — BP 119/88 | HR 82 | Ht 70.0 in | Wt 244.4 lb

## 2024-04-03 DIAGNOSIS — N182 Chronic kidney disease, stage 2 (mild): Secondary | ICD-10-CM

## 2024-04-03 DIAGNOSIS — I10 Essential (primary) hypertension: Secondary | ICD-10-CM

## 2024-04-03 DIAGNOSIS — E7800 Pure hypercholesterolemia, unspecified: Secondary | ICD-10-CM | POA: Diagnosis not present

## 2024-04-03 DIAGNOSIS — E559 Vitamin D deficiency, unspecified: Secondary | ICD-10-CM

## 2024-04-03 DIAGNOSIS — R7303 Prediabetes: Secondary | ICD-10-CM | POA: Diagnosis not present

## 2024-04-03 DIAGNOSIS — Z0000 Encounter for general adult medical examination without abnormal findings: Secondary | ICD-10-CM | POA: Diagnosis not present

## 2024-04-03 DIAGNOSIS — H9313 Tinnitus, bilateral: Secondary | ICD-10-CM

## 2024-04-03 NOTE — Patient Instructions (Addendum)
 Take vitamin D 5000 units daily for up to 3 months and then switch back to the 1000 unit capsules.  Return for blood work in 3-5 months.  You do not need an appointment.

## 2024-04-03 NOTE — Progress Notes (Signed)
 Complete physical exam   Patient: Samuel Mitchell   DOB: 11/20/1961   63 y.o. Male  MRN: [id number] Visit Date: 04/03/2024  Today's healthcare provider: Sherlyn Hay, DO   Chief Complaint  Patient presents with   Annual Exam   Subjective    Samuel Mitchell is a 63 y.o. male who presents today for a complete physical exam.  He reports consuming a general diet.  He gets exercise walking around, working in the yard, playing pickle ball and coaching baseball  He generally feels well. He reports sleeping well. He does not have additional problems to discuss today.    HPI  Samuel Mitchell is a 63 year old male who presents for a regular physical exam.  He feels well overall with no specific concerns.He has a history of hypertension and hyperlipidemia, managed with amlodipine and atorvastatin, respectively. No specific issues or changes with these conditions were discussed during the visit.  He has experienced tinnitus since 1989, attributed to noise exposure during [employer]. The tinnitus is described as a constant ringing that does not interfere with sleep. Occasionally, he hears a clicking sound when turning his head, which started a few months ago. This occurs in both ears and is not consistent.  He is currently taking vitamin D, which he started about six months ago, but occasionally forgets doses. He takes a daily dose of 1000 IU. Previous vitamin D level was 13 ng/mL.  He maintains an active lifestyle, engaging in activities such as walking, coaching baseball, working in the yard, and playing pickleball. He follows a general diet without specific restrictions. He has never smoked or consumed alcohol.    Past Medical History:  Diagnosis Date   Hypertension    Kidney stones    Wears contact lenses    Wears dentures    full upper   Past Surgical History:  Procedure Laterality Date   COLONOSCOPY WITH PROPOFOL N/A 09/09/2016   Procedure: COLONOSCOPY WITH  PROPOFOL;  Surgeon: Midge Minium, MD;  Location: [facility];  Service: Endoscopy;  Laterality: N/A;   FOOT SURGERY Left 2007   Social History   Socioeconomic History   Marital status: Married    Spouse name: Not on file   Number of children: Not on file   Years of education: Not on file   Highest education level: Not on file  Occupational History   Not on file  Tobacco Use   Smoking status: Never   Smokeless tobacco: Never  Substance and Sexual Activity   Alcohol use: No    Alcohol/week: 0.0 standard drinks of alcohol   Drug use: Not on file   Sexual activity: Not on file  Other Topics Concern   Not on file  Social History Narrative   Not on file   Social Drivers of Health   Financial Resource Strain: Patient Declined (12/07/2023)   Overall Financial Resource Strain (CARDIA)    Difficulty of Paying Living Expenses: Patient declined  Food Insecurity: Patient Declined (12/07/2023)   Hunger Vital Sign    Worried About Running Out of Food in the Last Year: Patient declined    Ran Out of Food in the Last Year: Patient declined  Transportation Needs: Patient Declined (12/07/2023)   PRAPARE - Administrator, Civil Service (Medical): Patient declined    Lack of Transportation (Non-Medical): Patient declined  Physical Activity: Unknown (12/07/2023)   Exercise Vital Sign    Days of  Exercise per Week: Patient declined    Minutes of Exercise per Session: Not on file  Stress: Patient Declined (12/07/2023)   [employer] of Occupational Health - Occupational Stress Questionnaire    Feeling of Stress : Patient declined  Social Connections: Unknown (12/07/2023)   Social Connection and Isolation Panel [NHANES]    Frequency of Communication with Friends and Family: Patient declined    Frequency of Social Gatherings with Friends and Family: Patient declined    Attends Religious Services: Patient declined    Database administrator or Organizations: Patient  declined    Attends Engineer, structural: Not on file    Marital Status: Patient declined  Catering manager Violence: Not on file   Family Status  Relation Name Status   Mother  Alive   Father  Deceased at age 50       MI   Brother  Alive   PGF  Deceased at age 37       unknown causes  No partnership data on file   Family History  Problem Relation Age of Onset   Alzheimer's disease Mother    Alcohol abuse Father    Hypertension Brother    No Known Allergies  Patient Care Team: Bridget Westbrooks, Monico Blitz, DO as PCP - General (Family Medicine)   Medications: Outpatient Medications Prior to Visit  Medication Sig   amLODipine (NORVASC) 10 MG tablet Take 1 tablet by mouth every dayTAKE 1 TABLET BY MOUTH EVERY DAY   atorvastatin (LIPITOR) 10 MG tablet Take 1 tablet (10 mg total) by mouth daily. TAKE 1 TABLET BY MOUTH EVERY DAY   No facility-administered medications prior to visit.    Review of Systems  Constitutional:  Negative for appetite change, chills, fatigue and fever.  HENT:  Negative for congestion, ear pain, hearing loss, nosebleeds and trouble swallowing.   Eyes:  Negative for pain and visual disturbance.  Respiratory:  Negative for cough, chest tightness and shortness of breath.   Cardiovascular:  Negative for chest pain, palpitations and leg swelling.  Gastrointestinal:  Negative for abdominal pain, blood in stool, constipation, diarrhea, nausea and vomiting.  Endocrine: Negative for polydipsia, polyphagia and polyuria.  Genitourinary:  Negative for dysuria and flank pain.  Musculoskeletal:  Negative for arthralgias, back pain, joint swelling, myalgias and neck stiffness.  Skin:  Negative for color change, rash and wound.  Neurological:  Negative for dizziness, tremors, seizures, speech difficulty, weakness, light-headedness and headaches.  Psychiatric/Behavioral:  Negative for behavioral problems, confusion, decreased concentration, dysphoric mood and sleep  disturbance. The patient is not nervous/anxious.   All other systems reviewed and are negative.     Objective    BP 119/88 (BP Location: Right Arm, Patient Position: Sitting, Cuff Size: Large)   Pulse 82   Ht 5\' 10"  (1.778 m)   Wt 244 lb 6.4 oz (110.9 kg)   SpO2 97%   BMI 35.07 kg/m    Physical Exam Vitals and nursing note reviewed.  Constitutional:      General: He is awake.     Appearance: Normal appearance.  HENT:     Head: Normocephalic and atraumatic.     Right Ear: Tympanic membrane, ear canal and external ear normal.     Left Ear: Tympanic membrane, ear canal and external ear normal.     Nose: Nose normal.     Mouth/Throat:     Mouth: Mucous membranes are moist.     Pharynx: Oropharynx is clear. No oropharyngeal exudate or  posterior oropharyngeal erythema.  Eyes:     General: No scleral icterus.    Extraocular Movements: Extraocular movements intact.     Conjunctiva/sclera: Conjunctivae normal.     Pupils: Pupils are equal, round, and reactive to light.  Neck:     Thyroid: No thyromegaly or thyroid tenderness.  Cardiovascular:     Rate and Rhythm: Normal rate and regular rhythm.     Pulses: Normal pulses.     Heart sounds: Normal heart sounds.  Pulmonary:     Effort: Pulmonary effort is normal. No tachypnea, bradypnea or respiratory distress.     Breath sounds: Normal breath sounds. No stridor. No wheezing, rhonchi or rales.  Abdominal:     General: Bowel sounds are normal. There is no distension.     Palpations: Abdomen is soft. There is no mass.     Tenderness: There is no abdominal tenderness. There is no guarding.     Hernia: No hernia is present.  Musculoskeletal:     Cervical back: Normal range of motion and neck supple.     Right lower leg: No edema.     Left lower leg: No edema.  Lymphadenopathy:     Cervical: No cervical adenopathy.  Skin:    General: Skin is warm and dry.  Neurological:     Mental Status: He is alert and oriented to person,  place, and time. Mental status is at baseline.  Psychiatric:        Mood and Affect: Mood normal.        Behavior: Behavior normal.      Last depression screening scores    04/03/2024    9:21 AM 12/02/2022    1:45 PM 11/11/2021    2:03 PM  PHQ 2/9 Scores  PHQ - 2 Score 0 0 0  PHQ- 9 Score 0 0 0   Last fall risk screening    12/08/2023    8:42 AM  Fall Risk   Falls in the past year? 0  Number falls in past yr: 0  Injury with Fall? 0   Last Audit-C alcohol use screening    12/07/2023   10:19 PM  Alcohol Use Disorder Test (AUDIT)  1. How often do you have a drink containing alcohol? 0   A score of 3 or more in women, and 4 or more in men indicates increased risk for alcohol abuse, EXCEPT if all of the points are from question 1   No results found for any visits on 04/03/24.  Assessment & Plan    Routine Health Maintenance and Physical Exam  Exercise Activities and Dietary recommendations  Goals   None     Immunization History  Administered Date(s) Administered   Td 02/28/2003   Tdap 06/13/2015    Health Maintenance  Topic Date Due   COVID-19 Vaccine (1) 04/19/2024 (Originally 11/01/1966)   Zoster Vaccines- Shingrix (1 of 2) 09/26/2024 (Originally [birth year]/01/1982)   INFLUENZA VACCINE  07/27/2024   DTaP/Tdap/Td (3 - Td or Tdap) 06/12/2025   Colonoscopy  09/09/2026   Hepatitis C Screening  Completed   HIV Screening  Completed   HPV VACCINES  Aged Out    Discussed health benefits of physical activity, and encouraged him to engage in regular exercise appropriate for his age and condition.   Annual physical exam  Essential (primary) hypertension -     Comprehensive metabolic panel with GFR  Hypercholesterolemia -     Lipid panel  Prediabetes -     Hemoglobin A1c  Bilateral tinnitus  Chronic kidney disease, stage 2, mildly decreased GFR -     Microalbumin / creatinine urine ratio  Vitamin D deficiency -     VITAMIN D 25 Hydroxy (Vit-D Deficiency,  Fractures)    Annual physical exam Physical exam overall unremarkable except as noted above. Routine lab work ordered as noted.  Discussed importance of vaccinations and potential complications of shingles. - Return for routine blood work in 3 months (rather than checking it today). - Encourage balanced diet and regular exercise. - Discuss benefits of COVID-19 and shingles vaccinations.  Vitamin D Deficiency Vitamin D level previously at 13 ng/mL. Current 1000 IU daily regimen insufficient. - Increase vitamin D to 5000 IU daily for three months. - Recheck vitamin D levels in three to five months. - Return to 1000 IU daily after three months.  Hypertension Managed with amlodipine.  No changes.  Hyperlipidemia Managed with atorvastatin.  No changes.  Tinnitus Chronic bilateral tinnitus since 1989, likely due to [employer] noise exposure.    Return in about 1 year (around 04/03/2025) for CPE.     I discussed the assessment and treatment plan with the patient  The patient was provided an opportunity to ask questions and all were answered. The patient agreed with the plan and demonstrated an understanding of the instructions.   The patient was advised to call back or seek an in-person evaluation if the symptoms worsen or if the condition fails to improve as anticipated.    Sherlyn Hay, DO  [facility] [phone] (phone) [phone] (fax)  [facility]

## 2024-06-06 DIAGNOSIS — E7800 Pure hypercholesterolemia, unspecified: Secondary | ICD-10-CM | POA: Diagnosis not present

## 2024-06-06 DIAGNOSIS — N182 Chronic kidney disease, stage 2 (mild): Secondary | ICD-10-CM | POA: Diagnosis not present

## 2024-06-06 DIAGNOSIS — E559 Vitamin D deficiency, unspecified: Secondary | ICD-10-CM | POA: Diagnosis not present

## 2024-06-06 DIAGNOSIS — I10 Essential (primary) hypertension: Secondary | ICD-10-CM | POA: Diagnosis not present

## 2024-06-06 DIAGNOSIS — R7303 Prediabetes: Secondary | ICD-10-CM | POA: Diagnosis not present

## 2024-06-07 LAB — COMPREHENSIVE METABOLIC PANEL WITH GFR
ALT: 21 IU/L (ref 0–44)
AST: 19 IU/L (ref 0–40)
Albumin: 4.1 g/dL (ref 3.8–4.9)
Alkaline Phosphatase: 123 IU/L — ABNORMAL HIGH (ref 44–121)
BUN/Creatinine Ratio: 13 (ref 10–24)
BUN: 14 mg/dL (ref 8–27)
Bilirubin Total: 0.8 mg/dL (ref 0.0–1.2)
CO2: 22 mmol/L (ref 20–29)
Calcium: 9.1 mg/dL (ref 8.6–10.2)
Chloride: 104 mmol/L (ref 96–106)
Creatinine, Ser: 1.11 mg/dL (ref 0.76–1.27)
Globulin, Total: 2.8 g/dL (ref 1.5–4.5)
Glucose: 93 mg/dL (ref 70–99)
Potassium: 4.1 mmol/L (ref 3.5–5.2)
Sodium: 139 mmol/L (ref 134–144)
Total Protein: 6.9 g/dL (ref 6.0–8.5)
eGFR: 76 mL/min/{1.73_m2} (ref >59)

## 2024-06-07 LAB — MICROALBUMIN / CREATININE URINE RATIO
Creatinine, Urine: 104.2 mg/dL
Microalb/Creat Ratio: <3 mg/g{creat} (ref 0–29)
Microalbumin, Urine: <3 ug/mL

## 2024-06-07 LAB — LIPID PANEL
Chol/HDL Ratio: 3.6 ratio (ref 0.0–5.0)
Cholesterol, Total: 142 mg/dL (ref 100–199)
HDL: 39 mg/dL — ABNORMAL LOW (ref >39)
LDL Chol Calc (NIH): 89 mg/dL (ref 0–99)
Triglycerides: 72 mg/dL (ref 0–149)
VLDL Cholesterol Cal: 14 mg/dL (ref 5–40)

## 2024-06-07 LAB — HEMOGLOBIN A1C
Est. average glucose Bld gHb Est-mCnc: 140 mg/dL
Hgb A1c MFr Bld: 6.5 % — ABNORMAL HIGH (ref 4.8–5.6)

## 2024-06-07 LAB — VITAMIN D 25 HYDROXY (VIT D DEFICIENCY, FRACTURES): Vit D, 25-Hydroxy: 49.1 ng/mL (ref 30.0–100.0)

## 2024-06-12 ENCOUNTER — Ambulatory Visit: Payer: Self-pay | Admitting: Family Medicine

## 2024-06-27 ENCOUNTER — Other Ambulatory Visit: Payer: Self-pay

## 2024-06-27 ENCOUNTER — Telehealth: Payer: Self-pay | Admitting: Family Medicine

## 2024-06-27 DIAGNOSIS — I10 Essential (primary) hypertension: Secondary | ICD-10-CM

## 2024-06-27 DIAGNOSIS — E7800 Pure hypercholesterolemia, unspecified: Secondary | ICD-10-CM

## 2024-06-27 MED ORDER — AMLODIPINE BESYLATE 10 MG PO TABS: ORAL_TABLET | ORAL | 1 refills | Status: DC

## 2024-06-27 MED ORDER — ATORVASTATIN CALCIUM 10 MG PO TABS: 10.0000 mg | ORAL_TABLET | Freq: Every day | ORAL | 1 refills | Status: DC

## 2024-06-27 NOTE — Telephone Encounter (Signed)
 Refill sent.

## 2024-06-27 NOTE — Telephone Encounter (Signed)
 [facility] faxed refill request for the following medications:   amLODipine  (NORVASC ) 10 MG tablet     atorvastatin  (LIPITOR) 10 MG tablet     Please advise.

## 2024-12-28 ENCOUNTER — Other Ambulatory Visit: Payer: Self-pay | Admitting: Family Medicine

## 2024-12-28 DIAGNOSIS — I10 Essential (primary) hypertension: Secondary | ICD-10-CM

## 2024-12-28 DIAGNOSIS — E7800 Pure hypercholesterolemia, unspecified: Secondary | ICD-10-CM
